# Patient Record
Sex: Male | Born: 1960
Health system: Southern US, Community
[De-identification: ages and names within clinical notes are randomized; demographics above are authoritative.]

## PROBLEM LIST (undated history)

## (undated) DIAGNOSIS — E7800 Pure hypercholesterolemia, unspecified: Secondary | ICD-10-CM

## (undated) DIAGNOSIS — I2510 Atherosclerotic heart disease of native coronary artery without angina pectoris: Secondary | ICD-10-CM

## (undated) DIAGNOSIS — R001 Bradycardia, unspecified: Secondary | ICD-10-CM

## (undated) DIAGNOSIS — I10 Essential (primary) hypertension: Secondary | ICD-10-CM

## (undated) HISTORY — PX: TONSILLECTOMY: SUR1361

---

## 2002-02-21 ENCOUNTER — Encounter: Payer: Self-pay | Admitting: Family Medicine

## 2002-02-21 ENCOUNTER — Ambulatory Visit (HOSPITAL_COMMUNITY): Admission: RE | Admit: 2002-02-21 | Discharge: 2002-02-21 | Payer: Self-pay | Admitting: Family Medicine

## 2002-02-24 ENCOUNTER — Ambulatory Visit (HOSPITAL_COMMUNITY): Admission: RE | Admit: 2002-02-24 | Discharge: 2002-02-24 | Payer: Self-pay | Admitting: Family Medicine

## 2002-02-24 ENCOUNTER — Encounter: Payer: Self-pay | Admitting: Family Medicine

## 2004-10-09 ENCOUNTER — Ambulatory Visit (HOSPITAL_COMMUNITY): Admission: RE | Admit: 2004-10-09 | Discharge: 2004-10-09 | Payer: Self-pay | Admitting: Family Medicine

## 2015-02-23 ENCOUNTER — Encounter (HOSPITAL_COMMUNITY): Payer: Self-pay | Admitting: *Deleted

## 2015-02-23 ENCOUNTER — Other Ambulatory Visit: Payer: Self-pay | Admitting: Cardiology

## 2015-02-23 ENCOUNTER — Emergency Department (HOSPITAL_COMMUNITY): Payer: 59

## 2015-02-23 ENCOUNTER — Inpatient Hospital Stay (HOSPITAL_COMMUNITY)
Admission: EM | Admit: 2015-02-23 | Discharge: 2015-02-26 | DRG: 246 | Disposition: A | Discharge status: Home / Self Care | Payer: 59 | Attending: Internal Medicine | Admitting: Internal Medicine

## 2015-02-23 DIAGNOSIS — R079 Chest pain, unspecified: Secondary | ICD-10-CM | POA: Diagnosis not present

## 2015-02-23 DIAGNOSIS — Z7982 Long term (current) use of aspirin: Secondary | ICD-10-CM | POA: Diagnosis not present

## 2015-02-23 DIAGNOSIS — Z6835 Body mass index (BMI) 35.0-35.9, adult: Secondary | ICD-10-CM | POA: Diagnosis not present

## 2015-02-23 DIAGNOSIS — Z823 Family history of stroke: Secondary | ICD-10-CM

## 2015-02-23 DIAGNOSIS — I214 Non-ST elevation (NSTEMI) myocardial infarction: Principal | ICD-10-CM | POA: Diagnosis present

## 2015-02-23 DIAGNOSIS — E6609 Other obesity due to excess calories: Secondary | ICD-10-CM | POA: Diagnosis present

## 2015-02-23 DIAGNOSIS — R001 Bradycardia, unspecified: Secondary | ICD-10-CM | POA: Diagnosis present

## 2015-02-23 DIAGNOSIS — Z7902 Long term (current) use of antithrombotics/antiplatelets: Secondary | ICD-10-CM | POA: Diagnosis not present

## 2015-02-23 DIAGNOSIS — E780 Pure hypercholesterolemia: Secondary | ICD-10-CM | POA: Diagnosis present

## 2015-02-23 DIAGNOSIS — I10 Essential (primary) hypertension: Secondary | ICD-10-CM | POA: Diagnosis present

## 2015-02-23 DIAGNOSIS — I2510 Atherosclerotic heart disease of native coronary artery without angina pectoris: Secondary | ICD-10-CM | POA: Diagnosis present

## 2015-02-23 DIAGNOSIS — E785 Hyperlipidemia, unspecified: Secondary | ICD-10-CM | POA: Diagnosis present

## 2015-02-23 DIAGNOSIS — I2542 Coronary artery dissection: Secondary | ICD-10-CM | POA: Diagnosis not present

## 2015-02-23 DIAGNOSIS — I472 Ventricular tachycardia: Secondary | ICD-10-CM | POA: Diagnosis not present

## 2015-02-23 DIAGNOSIS — Z79899 Other long term (current) drug therapy: Secondary | ICD-10-CM | POA: Diagnosis not present

## 2015-02-23 HISTORY — DX: Bradycardia, unspecified: R00.1

## 2015-02-23 HISTORY — DX: Atherosclerotic heart disease of native coronary artery without angina pectoris: I25.10

## 2015-02-23 HISTORY — DX: Essential (primary) hypertension: I10

## 2015-02-23 HISTORY — DX: Pure hypercholesterolemia, unspecified: E78.00

## 2015-02-23 LAB — PROTIME-INR
INR: 0.97 (ref 0.00–1.49)
Prothrombin Time: 13.1 seconds (ref 11.6–15.2)

## 2015-02-23 LAB — CBC WITH DIFFERENTIAL/PLATELET
BASOS ABS: 0 10*3/uL (ref 0.0–0.1)
BASOS PCT: 0 % (ref 0–1)
EOS ABS: 0.2 10*3/uL (ref 0.0–0.7)
EOS PCT: 2 % (ref 0–5)
HCT: 40.4 % (ref 39.0–52.0)
Hemoglobin: 13.9 g/dL (ref 13.0–17.0)
LYMPHS PCT: 28 % (ref 12–46)
Lymphs Abs: 1.9 10*3/uL (ref 0.7–4.0)
MCH: 30.3 pg (ref 26.0–34.0)
MCHC: 34.4 g/dL (ref 30.0–36.0)
MCV: 88.2 fL (ref 78.0–100.0)
Monocytes Absolute: 0.5 10*3/uL (ref 0.1–1.0)
Monocytes Relative: 7 % (ref 3–12)
NEUTROS ABS: 4.3 10*3/uL (ref 1.7–7.7)
Neutrophils Relative %: 63 % (ref 43–77)
PLATELETS: 170 10*3/uL (ref 150–400)
RBC: 4.58 MIL/uL (ref 4.22–5.81)
RDW: 12.9 % (ref 11.5–15.5)
WBC: 6.9 10*3/uL (ref 4.0–10.5)

## 2015-02-23 LAB — COMPREHENSIVE METABOLIC PANEL
ALBUMIN: 4 g/dL (ref 3.5–5.0)
ALT: 39 U/L (ref 17–63)
ANION GAP: 10 (ref 5–15)
AST: 37 U/L (ref 15–41)
Alkaline Phosphatase: 57 U/L (ref 38–126)
BUN: 13 mg/dL (ref 6–20)
CO2: 26 mmol/L (ref 22–32)
CREATININE: 0.96 mg/dL (ref 0.61–1.24)
Calcium: 9.2 mg/dL (ref 8.9–10.3)
Chloride: 104 mmol/L (ref 101–111)
GFR calc Af Amer: >60 mL/min (ref >60)
GFR calc non Af Amer: >60 mL/min (ref >60)
Glucose, Bld: 115 mg/dL — ABNORMAL HIGH (ref 65–99)
Potassium: 4 mmol/L (ref 3.5–5.1)
Sodium: 140 mmol/L (ref 135–145)
TOTAL PROTEIN: 7 g/dL (ref 6.5–8.1)
Total Bilirubin: 0.6 mg/dL (ref 0.3–1.2)

## 2015-02-23 LAB — I-STAT TROPONIN, ED: Troponin i, poc: 0.62 ng/mL (ref 0.00–0.08)

## 2015-02-23 LAB — TROPONIN I: Troponin I: 0.85 ng/mL (ref <0.031)

## 2015-02-23 LAB — LIPASE, BLOOD: Lipase: 30 U/L (ref 22–51)

## 2015-02-23 MED ORDER — ASPIRIN 81 MG PO CHEW
324.0000 mg | CHEWABLE_TABLET | Freq: Once | ORAL | Status: AC
  Administered 2015-02-23: 324 mg via ORAL

## 2015-02-23 MED ORDER — HEPARIN (PORCINE) IN NACL 100-0.45 UNIT/ML-% IJ SOLN
INTRAMUSCULAR | Status: AC
  Administered 2015-02-23: 25000 [IU] via INTRAVENOUS
  Filled 2015-02-23: qty 250

## 2015-02-23 MED ORDER — ONDANSETRON HCL 4 MG/2ML IJ SOLN
INTRAMUSCULAR | Status: AC
  Administered 2015-02-23: 4 mg
  Filled 2015-02-23: qty 2

## 2015-02-23 MED ORDER — HEPARIN BOLUS VIA INFUSION
4000.0000 [IU] | Freq: Once | INTRAVENOUS | Status: AC
  Administered 2015-02-23: 4000 [IU] via INTRAVENOUS

## 2015-02-23 MED ORDER — MORPHINE SULFATE 4 MG/ML IJ SOLN
INTRAMUSCULAR | Status: AC
  Administered 2015-02-23: 4 mg
  Filled 2015-02-23: qty 1

## 2015-02-23 MED ORDER — ASPIRIN 81 MG PO CHEW
CHEWABLE_TABLET | ORAL | Status: AC
  Filled 2015-02-23: qty 4

## 2015-02-23 MED ORDER — NITROGLYCERIN 2 % TD OINT
1.0000 [in_us] | TOPICAL_OINTMENT | Freq: Once | TRANSDERMAL | Status: AC
  Administered 2015-02-23: 1 [in_us] via TOPICAL
  Filled 2015-02-23: qty 1

## 2015-02-23 MED ORDER — HEPARIN (PORCINE) IN NACL 100-0.45 UNIT/ML-% IJ SOLN
1400.0000 [IU]/h | INTRAMUSCULAR | Status: DC
  Administered 2015-02-23: 25000 [IU] via INTRAVENOUS

## 2015-02-23 NOTE — ED Notes (Signed)
CRITICAL VALUE ALERT  Critical value received:  Trop 0.85  Date of notification:  02/23/15  Time of notification:  2121  Critical value read back:Yes.    Nurse who received alert:  [facility]  MD notified (1st page):  2121  Time of first page:  2121  MD notified (2nd page):  Time of second page:  Responding MD:  Effie Shy  Time MD responded:  2121

## 2015-02-23 NOTE — ED Notes (Signed)
Dr. Effie Shy notified of elevated troponin per i-stat.

## 2015-02-23 NOTE — Progress Notes (Signed)
ANTICOAGULATION CONSULT NOTE - Initial Consult  Pharmacy Consult for Heparin Indication: chest pain/ACS  No Known Allergies  Patient Measurements: Height:  (180.3 cm) Weight: 246 lb (111.585 kg) IBW/kg (Calculated) : 75.3 Heparin Dosing Weight: 99.3 kg  Vital Signs: Temp: 97.9 F (36.6 C) (07/23 1949) Temp Source: Oral (07/23 1949) BP: 133/69 mmHg (07/23 2030) Pulse Rate: 55 (07/23 2030)  Labs:  Recent Labs  02/23/15 1955  HGB 13.9  HCT 40.4  PLT 170  CREATININE 0.96    Estimated Creatinine Clearance: 110.4 mL/min (by C-G formula based on Cr of 0.96).   Medical History: Past Medical History  Diagnosis Date  . Hypertension   . Hypercholesterolemia     Medications:   (Not in a hospital admission)  Assessment: 54 yo male ED patient central chest pressure and tightness. Pain has been constant. Elevated troponin Labs reviewed PTA medications reviewed, no anticoagulants noted. Patient to be transferred to [facility]  Goal of Therapy:  Heparin level 0.3-0.7 units/ml Monitor platelets by anticoagulation protocol: Yes   Plan:  Give 4000 units bolus x 1 Start heparin infusion at 1400 units/hr Check anti-Xa level in 6-8  hours and daily while on heparin Continue to monitor H&H and platelets  Raquel James, Hayly Litsey Bennett 02/23/2015,9:17 PM

## 2015-02-23 NOTE — ED Provider Notes (Addendum)
CSN: [id number]     Arrival date & time 02/23/15  1941 History   First MD Initiated Contact with Patient 02/23/15 2019     Chief Complaint  Patient presents with  . Chest Pain     (Consider location/radiation/quality/duration/timing/severity/associated sxs/prior Treatment) HPI   Dennis Morrow is a 54 y.o. male who presents for evaluation of chest pain. He describes the chest pain as bilateral anterior as a "tight feeling" which radiates to his bilateral triceps. The pain has been constant since yesterday. The pain was somewhat better yesterday, but has worsened today. The pain is now constant at 5/10, for the last hours. He has taken aspirin times without relief of the pain, today. No similar episodes of pain in the past. He is been able to work doing his usual job yesterday, and some work around the house today. 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   Past Medical History  Diagnosis Date  . Hypertension   . Hypercholesterolemia    Past Surgical History  Procedure Laterality Date  . Tonsillectomy     Family History  Problem Relation Age of Onset  . CVA Mother    History  Substance Use Topics  . Smoking status: Never Smoker   . Smokeless tobacco: Not on file  . Alcohol Use: Yes     Comment: socially    Review of Systems    Allergies  Review of patient's allergies indicates no known allergies.  Home Medications   Prior to Admission medications   Medication Sig Start Date End Date Taking? Authorizing Provider  aspirin 81 MG chewable tablet Chew 81 mg by mouth once.   Yes Historical Provider, MD  calcium carbonate (TUMS - DOSED IN MG ELEMENTAL CALCIUM) 500 MG chewable tablet Chew 2 tablets by mouth as needed for indigestion or heartburn.   Yes Historical Provider, MD  ibuprofen (ADVIL,MOTRIN) 200 MG tablet Take 400 mg by mouth every 6 (six) hours as needed for moderate pain.   Yes Historical Provider, MD   BP 135/78 mmHg  Pulse 61  Temp(Src) 98.4 F  (36.9 C) (Oral)  Resp 18  Ht  (1.803 m)  Wt 253 lb 4.8 oz (114.896 kg)  BMI 35.34 kg/m2  SpO2 100% Physical Exam  ED Course  Procedures (including critical care time)  Medications  heparin ADULT infusion 100 units/mL (25000 units/250 mL) (25,000 Units Intravenous New Bag/Given 02/23/15 2120)  aspirin chewable tablet 324 mg (324 mg Oral Given 02/23/15 2022)  aspirin 81 MG chewable tablet (  Duplicate 02/23/15 2024)  heparin bolus via infusion 4,000 Units (4,000 Units Intravenous Given 02/23/15 2141)  nitroGLYCERIN (NITROGLYN) 2 % ointment 1 inch (1 inch Topical Given 02/23/15 2053)  morphine 4 MG/ML injection (4 mg  Given 02/23/15 2233)  ondansetron (ZOFRAN) 4 MG/2ML injection (4 mg  Given 02/23/15 2233)    Patient Vitals for the past 24 hrs:  BP Temp Temp src Pulse Resp SpO2 Height Weight  02/23/15 2355 135/78 mmHg 98.4 F (36.9 C) Oral 61 18 100 % - -  02/23/15 2349 - - - - - -  (1.803 m) 253 lb 4.8 oz (114.896 kg)  02/23/15 2300 119/56 mmHg - - 86 18 98 % - -  02/23/15 2200 130/70 mmHg - - (!) 59 20 99 % - -  02/23/15 2130 130/78 mmHg - - (!) 59 19 98 % - -  02/23/15 2100 124/66 mmHg - - (!) 53 22 98 % - -  02/23/15 2030 133/69 mmHg - - (!) 55 17 99 % - -  02/23/15 2000 136/74 mmHg - - (!) 55 24 97 % - -  02/23/15 1949 144/82 mmHg 97.9 F (36.6 C) Oral 63 19 99 % 5\' 11"  (1.803 m) 246 lb (111.585 kg)      Page to cardiology, at 2031; call back and discussion completed at 2050. Dr. Mayford Knife requests, that I start heparin, as anticoagulation, order a troponin I, and call her back.  Reevaluation- 21:00- patient is somewhat more comfortable at this time. He denies shortness of breath. He has mild diaphoresis.  Reevaluation- 21:30- pain is better now 3/10. Transfer being arranged to [facility] hospital.   CRITICAL CARE Performed by: Flint Melter Total critical care time: 45 minutes Critical care time was exclusive of separately billable procedures and treating other  patients. Critical care was necessary to treat or prevent imminent or life-threatening deterioration. Critical care was time spent personally by me on the following activities: development of treatment plan with patient and/or surrogate as well as nursing, discussions with consultants, evaluation of patient's response to treatment, examination of patient, obtaining history from patient or surrogate, ordering and performing treatments and interventions, ordering and review of laboratory studies, ordering and review of radiographic studies, pulse oximetry and re-evaluation of patient's condition.    Labs Review Labs Reviewed  COMPREHENSIVE METABOLIC PANEL - Abnormal; Notable for the following:    Glucose, Bld 115 (*)    All other components within normal limits  TROPONIN I - Abnormal; Notable for the following:    Troponin I 0.85 (*)    All other components within normal limits  I-STAT TROPOININ, ED - Abnormal; Notable for the following:    Troponin i, poc 0.62 (*)    All other components within normal limits  CBC WITH DIFFERENTIAL/PLATELET  LIPASE, BLOOD  PROTIME-INR  HEPARIN LEVEL (UNFRACTIONATED)    Imaging Review Dg Chest 2 View  02/23/2015   CLINICAL DATA:  Chest pain and fatigue  EXAM: CHEST  2 VIEW  COMPARISON:  None.  FINDINGS: Lungs are clear. Heart size and pulmonary vascularity are normal. No adenopathy. No bone lesions. No pneumothorax.  IMPRESSION: No abnormality noted.   Electronically Signed   By: Bretta Bang III M.D.   On: 02/23/2015 20:45     EKG Interpretation   Date/Time:  Saturday February 23 2015 19:50:52 EDT Ventricular Rate:  54 PR Interval:  161 QRS Duration: 77 QT Interval:  436 QTC Calculation: 413 R Axis:   13 Text Interpretation:  Sinus rhythm Low voltage, precordial leads No old  tracing to compare Confirmed by Gwenna Fuston  MD, Shakari Qazi [phone]) on 02/23/2015  8:20:00 PM      MDM   Final diagnoses:  NSTEMI (non-ST elevated myocardial infarction)     Chest pain secondary to NSTEMI. Patient's pain improved after treatment. He will require admission and stabilization in a cardiology setting. No acute CHF or evidence for  progressive coronary ischemia.  Nursing Notes Reviewed/ Care Coordinated Applicable Imaging Reviewed Interpretation of Laboratory Data incorporated into ED treatment   Plan- Transfer to [facility]    Mancel Bale, MD 02/24/15 0009  Mancel Bale, MD 03/13/15 1249

## 2015-02-23 NOTE — ED Notes (Signed)
Central chest pressure started around 1330 yesterday. Describes as pressure and tightness.  Denies SOB, but has had lightheadedness and weakness.  Pain has been constant and is 5/10.

## 2015-02-23 NOTE — H&P (Addendum)
Expand All Collapse All     Admit date: (Not on file) Referring Physician: Dr. Effie Shy Primary Cardiologist: None Chief complaint/reason for admission:Chest pain  HPI: This is a 53yo WM with a history of HTN and dyslipidemia who presented to [facility] with complaints of chest pain. The patient has been constant since yesterday. Today the pain worsened. He describes it as a tightness in his chest on both sides and radiates to his arms bilaterally. He took ASA without any relief today. 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 He says that today he was outside some and thought it was related to the heat.  This afternoon he went to the pool and then came home to grill. At dinner his pain all of a sudden escalated and he went to the ER.  He presented to [facility] ER where EKG was nonischemic. Initial POC trop was 0.62 and lab Troponin was elevated at 0.85. He is now transferred to [facility] for further evaluation.     PMH:  Past Medical History  Diagnosis Date  . Hypertension   . Hypercholesterolemia     PSH:  Past Surgical History  Procedure Laterality Date  . Tonsillectomy     ALLERGIES: Review of patient's allergies indicates no known allergies. Prior to Admit Meds:  (Not in a hospital admission) Family HX: No family history on file. Social HX:  History   Social History  . Marital Status: Married    Spouse Name: N/A  . Number of Children: N/A  . Years of Education: N/A   Occupational History  . Not on file.   Social History Main Topics  . Smoking status: Never Smoker   . Smokeless tobacco: Not on file  . Alcohol Use: Yes     Comment: socially  . Drug Use: No  . Sexual Activity: Not on file   Other Topics Concern  . Not on file   Social History Narrative  . No narrative on file     ROS: All 11 ROS were addressed and  are negative except what is stated in the HPI  PHYSICAL EXAM There were no vitals filed for this visit. General: Well developed, well nourished, in no acute distress Head: Eyes PERRLA, No xanthomas. Normal cephalic and atramatic Lungs: Clear bilaterally to auscultation and percussion. Heart: HRRR S1 S2 Pulses are 2+ & equal.  No carotid bruit. No JVD. No abdominal bruits. No femoral bruits. Abdomen: Bowel sounds are positive, abdomen soft and non-tender without masses  Extremities: No clubbing, cyanosis or edema. DP +1 Neuro: Alert and oriented X 3. Psych: Good affect, responds appropriately   Labs:  Lab Results  Component Value Date   WBC 6.9 02/23/2015   HGB 13.9 02/23/2015   HCT 40.4 02/23/2015   MCV 88.2 02/23/2015   PLT 170 02/23/2015    Recent Labs Lab 02/23/15 1955  NA 140  K 4.0  CL 104  CO2 26  BUN 13  CREATININE 0.96  CALCIUM 9.2  PROT 7.0  BILITOT 0.6  ALKPHOS 57  ALT 39  AST 37  GLUCOSE 115*    Recent Labs    Lab Results  Component Value Date   TROPONINI 0.85* 02/23/2015      Recent Labs    No results found for: PTT    Recent Labs    Lab Results  Component Value Date   INR 0.97 02/23/2015  Recent Labs    No results found for: CHOL    Recent Labs    No results found for: HDL    Recent Labs    No results found for: LDLCALC    Recent Labs    No results found for: TRIG    Recent Labs    No results found for: CHOLHDL    Recent Labs    No results found for: LDLDIRECT     Radiology: Dg Chest 2 View  02/23/2015 CLINICAL DATA: Chest pain and fatigue EXAM: CHEST 2 VIEW COMPARISON: None. FINDINGS: Lungs are clear. Heart size and pulmonary vascularity are normal. No adenopathy. No bone lesions. No pneumothorax. IMPRESSION: No abnormality noted. Electronically Signed By: Bretta Bang III M.D. On: 02/23/2015 20:45     EKG: Sinus bradycardia with no ST changes  ASSESSMENT/PLAN: 1. NSTEMI - CP has been constant since yesterday but EKG is nonischemic. He currently is pain free on IV Heparin gtt. Add NTG gtt.  Will continue IV Heparin/ASA.  Add high dose statin.  Check 2D echo in am.  Will need cath on Monday to evaluate coronary anatomy.  Check HbA1C and FLP in am.   2.  HTN - controlled 3.  Dyslipidemia - adding statin - he has had some problems with aches in the past on statins. 4.  NSVT on monitor shortly after admission.  He had been slightly bradycardic in the ER but HR now in the 70's.  Will add low dose BB with lopressor 12.5mg  BID.  If he has any further episodes may need to add IV Amio.      Quintella Reichert, MD  02/23/2015  11:41 PM

## 2015-02-24 ENCOUNTER — Encounter (HOSPITAL_COMMUNITY): Payer: Self-pay | Admitting: Cardiology

## 2015-02-24 DIAGNOSIS — I214 Non-ST elevation (NSTEMI) myocardial infarction: Secondary | ICD-10-CM | POA: Diagnosis present

## 2015-02-24 LAB — HEPARIN LEVEL (UNFRACTIONATED)
Heparin Unfractionated: 0.25 IU/mL — ABNORMAL LOW (ref 0.30–0.70)
Heparin Unfractionated: 0.58 IU/mL (ref 0.30–0.70)

## 2015-02-24 LAB — COMPREHENSIVE METABOLIC PANEL
ALT: 36 U/L (ref 17–63)
ANION GAP: 11 (ref 5–15)
AST: 51 U/L — ABNORMAL HIGH (ref 15–41)
Albumin: 3.4 g/dL — ABNORMAL LOW (ref 3.5–5.0)
Alkaline Phosphatase: 54 U/L (ref 38–126)
BUN: 14 mg/dL (ref 6–20)
CHLORIDE: 103 mmol/L (ref 101–111)
CO2: 25 mmol/L (ref 22–32)
Calcium: 8.5 mg/dL — ABNORMAL LOW (ref 8.9–10.3)
Creatinine, Ser: 1.05 mg/dL (ref 0.61–1.24)
GFR calc Af Amer: >60 mL/min (ref >60)
GFR calc non Af Amer: >60 mL/min (ref >60)
Glucose, Bld: 146 mg/dL — ABNORMAL HIGH (ref 65–99)
Potassium: 4 mmol/L (ref 3.5–5.1)
Sodium: 139 mmol/L (ref 135–145)
Total Bilirubin: 0.5 mg/dL (ref 0.3–1.2)
Total Protein: 6.1 g/dL — ABNORMAL LOW (ref 6.5–8.1)

## 2015-02-24 LAB — CBC WITH DIFFERENTIAL/PLATELET
Basophils Absolute: 0 10*3/uL (ref 0.0–0.1)
Basophils Relative: 0 % (ref 0–1)
EOS ABS: 0.2 10*3/uL (ref 0.0–0.7)
Eosinophils Relative: 2 % (ref 0–5)
HEMATOCRIT: 38 % — AB (ref 39.0–52.0)
HEMOGLOBIN: 13 g/dL (ref 13.0–17.0)
Lymphocytes Relative: 24 % (ref 12–46)
Lymphs Abs: 2 10*3/uL (ref 0.7–4.0)
MCH: 30.2 pg (ref 26.0–34.0)
MCHC: 34.2 g/dL (ref 30.0–36.0)
MCV: 88.2 fL (ref 78.0–100.0)
MONOS PCT: 6 % (ref 3–12)
Monocytes Absolute: 0.5 10*3/uL (ref 0.1–1.0)
Neutro Abs: 5.5 10*3/uL (ref 1.7–7.7)
Neutrophils Relative %: 68 % (ref 43–77)
Platelets: 212 10*3/uL (ref 150–400)
RBC: 4.31 MIL/uL (ref 4.22–5.81)
RDW: 13.3 % (ref 11.5–15.5)
WBC: 8.2 10*3/uL (ref 4.0–10.5)

## 2015-02-24 LAB — APTT: aPTT: 65 seconds — ABNORMAL HIGH (ref 24–37)

## 2015-02-24 LAB — TSH: TSH: 3.59 u[IU]/mL (ref 0.350–4.500)

## 2015-02-24 LAB — MAGNESIUM: MAGNESIUM: 1.9 mg/dL (ref 1.7–2.4)

## 2015-02-24 LAB — PROTIME-INR
INR: 1 (ref 0.00–1.49)
Prothrombin Time: 13.4 seconds (ref 11.6–15.2)

## 2015-02-24 LAB — TROPONIN I
TROPONIN I: 8.83 ng/mL — AB (ref <0.031)
Troponin I: 3.45 ng/mL (ref <0.031)
Troponin I: 9.52 ng/mL (ref <0.031)

## 2015-02-24 LAB — OCCULT BLOOD X 1 CARD TO LAB, STOOL: Fecal Occult Bld: NEGATIVE

## 2015-02-24 MED ORDER — METOPROLOL TARTRATE 12.5 MG HALF TABLET
12.5000 mg | ORAL_TABLET | Freq: Two times a day (BID) | ORAL | Status: DC
  Administered 2015-02-24 – 2015-02-26 (×5): 12.5 mg via ORAL
  Filled 2015-02-24 (×5): qty 1

## 2015-02-24 MED ORDER — NITROGLYCERIN 0.4 MG SL SUBL: 0.4000 mg | SUBLINGUAL_TABLET | SUBLINGUAL | Status: DC | PRN

## 2015-02-24 MED ORDER — METOPROLOL TARTRATE 12.5 MG HALF TABLET
12.5000 mg | ORAL_TABLET | Freq: Two times a day (BID) | ORAL | Status: DC
  Filled 2015-02-24: qty 1

## 2015-02-24 MED ORDER — ASPIRIN EC 81 MG PO TBEC
81.0000 mg | DELAYED_RELEASE_TABLET | Freq: Every day | ORAL | Status: DC
  Administered 2015-02-24 – 2015-02-26 (×3): 81 mg via ORAL
  Filled 2015-02-24 (×3): qty 1

## 2015-02-24 MED ORDER — SODIUM CHLORIDE 0.9 % IJ SOLN
3.0000 mL | Freq: Two times a day (BID) | INTRAMUSCULAR | Status: DC
  Administered 2015-02-24 – 2015-02-25 (×2): 3 mL via INTRAVENOUS

## 2015-02-24 MED ORDER — SODIUM CHLORIDE 0.9 % WEIGHT BASED INFUSION: 1.0000 mL/kg/h | INTRAVENOUS | Status: DC

## 2015-02-24 MED ORDER — SODIUM CHLORIDE 0.9 % IV SOLN: 250.0000 mL | INTRAVENOUS | Status: DC | PRN

## 2015-02-24 MED ORDER — ASPIRIN 81 MG PO CHEW
81.0000 mg | CHEWABLE_TABLET | ORAL | Status: AC
  Administered 2015-02-25: 81 mg via ORAL
  Filled 2015-02-24: qty 1

## 2015-02-24 MED ORDER — ASPIRIN 81 MG PO CHEW: 81.0000 mg | CHEWABLE_TABLET | Freq: Once | ORAL | Status: DC

## 2015-02-24 MED ORDER — HEPARIN (PORCINE) IN NACL 100-0.45 UNIT/ML-% IJ SOLN
1650.0000 [IU]/h | INTRAMUSCULAR | Status: DC
  Administered 2015-02-24 – 2015-02-25 (×2): 1650 [IU]/h via INTRAVENOUS
  Filled 2015-02-24 (×2): qty 250

## 2015-02-24 MED ORDER — ACETAMINOPHEN 325 MG PO TABS
650.0000 mg | ORAL_TABLET | ORAL | Status: DC | PRN
  Administered 2015-02-24 (×2): 650 mg via ORAL
  Filled 2015-02-24 (×4): qty 2

## 2015-02-24 MED ORDER — ASPIRIN 81 MG PO CHEW: 324.0000 mg | CHEWABLE_TABLET | ORAL | Status: DC

## 2015-02-24 MED ORDER — NITROGLYCERIN IN D5W 200-5 MCG/ML-% IV SOLN
3.0000 ug/min | INTRAVENOUS | Status: DC
  Administered 2015-02-24: 5 ug/min via INTRAVENOUS
  Filled 2015-02-24: qty 250

## 2015-02-24 MED ORDER — ASPIRIN 300 MG RE SUPP: 300.0000 mg | RECTAL | Status: DC

## 2015-02-24 MED ORDER — SODIUM CHLORIDE 0.9 % WEIGHT BASED INFUSION
3.0000 mL/kg/h | INTRAVENOUS | Status: DC
  Administered 2015-02-25: 3 mL/kg/h via INTRAVENOUS

## 2015-02-24 MED ORDER — ONDANSETRON HCL 4 MG/2ML IJ SOLN: 4.0000 mg | Freq: Four times a day (QID) | INTRAMUSCULAR | Status: DC | PRN

## 2015-02-24 MED ORDER — ATORVASTATIN CALCIUM 80 MG PO TABS
80.0000 mg | ORAL_TABLET | Freq: Every day | ORAL | Status: DC
  Administered 2015-02-24 – 2015-02-26 (×3): 80 mg via ORAL
  Filled 2015-02-24 (×3): qty 1

## 2015-02-24 MED ORDER — SODIUM CHLORIDE 0.9 % IJ SOLN: 3.0000 mL | INTRAMUSCULAR | Status: DC | PRN

## 2015-02-24 NOTE — Progress Notes (Signed)
CRITICAL VALUE ALERT  Critical value received:  Troponin 3.45  Date of notification:  02/24/15  Time of notification:  0220  Critical value read back:Yes.    Nurse who received alert:  Merry Proud, RN  MD notified (1st page):  Dr. Mayford Knife  Time of first page:  0225  MD notified (2nd page):  Time of second page:  Responding MD:  Dr. Mayford Knife  Time MD responded:  [phone]

## 2015-02-24 NOTE — Progress Notes (Signed)
ANTICOAGULATION CONSULT NOTE - Follow Up Consult  Pharmacy Consult for Heparin  Indication: chest pain/ACS  No Known Allergies  Patient Measurements: Height:  (180.3 cm) Weight: 253 lb 4.8 oz (114.896 kg) IBW/kg (Calculated) : 75.3  Vital Signs: Temp: 98.4 F (36.9 C) (07/23 2355) Temp Source: Oral (07/23 2355) BP: 135/78 mmHg (07/23 2355) Pulse Rate: 61 (07/23 2355)  Labs:  Recent Labs  02/23/15 1955 02/24/15 0106  HGB 13.9 13.0  HCT 40.4 38.0*  PLT 170 212  APTT  --  65*  LABPROT 13.1 13.4  INR 0.97 1.00  HEPARINUNFRC  --  0.25*  CREATININE 0.96  --   TROPONINI 0.85*  --     Estimated Creatinine Clearance: 112 mL/min (by C-G formula based on Cr of 0.96).   Assessment: Tx from [facility], initial heparin level is sub-therapeutic, other labs reviewed, no issues per RN.   Goal of Therapy:  Heparin level 0.3-0.7 units/ml Monitor platelets by anticoagulation protocol: Yes   Plan:  -Increase heparin to 1650 units/hr -1000 HL -Daily CBC/HL -Monitor for bleeding -Likely cath monday  Gianlucas Evenson 02/24/2015,2:19 AM

## 2015-02-24 NOTE — Progress Notes (Signed)
Subjective:  No recurrent chest pain overnight.  Troponin has risen to 3.145.  No shortness of breath.  Complains of mild headache.  Objective:  Vital Signs in the last 24 hours: BP 132/74 mmHg  Pulse 50  Temp(Src) 98 F (36.7 C) (Oral)  Resp 16  Ht  (1.803 m)  Wt 114.896 kg (253 lb 4.8 oz)  BMI 35.34 kg/m2  SpO2 99%  Physical Exam: Obese white male currently in no acute distress Lungs:  Clear Cardiac:  Regular rhythm, normal S1 and S2, no S3 Abdomen:  Soft, nontender, no masses Extremities:  No edema present  Intake/Output from previous day: 07/23 0701 - 07/24 0700 In: 151.4 [I.V.:151.4] Out: -   Weight Filed Weights   02/23/15 1949 02/23/15 2349  Weight: 111.585 kg (246 lb) 114.896 kg (253 lb 4.8 oz)    Lab Results: Basic Metabolic Panel:  Recent Labs  96/04/54 1955 02/24/15 0106  NA 140 139  K 4.0 4.0  CL 104 103  CO2 26 25  GLUCOSE 115* 146*  BUN 13 14  CREATININE 0.96 1.05   CBC:  Recent Labs  02/23/15 1955 02/24/15 0106  WBC 6.9 8.2  NEUTROABS 4.3 5.5  HGB 13.9 13.0  HCT 40.4 38.0*  MCV 88.2 88.2  PLT 170 212   Cardiac Enzymes: Troponin (Point of Care Test)  Recent Labs  02/23/15 1959  TROPIPOC 0.62*   Cardiac Panel (last 3 results)  Recent Labs  02/23/15 1955 02/24/15 0106  TROPONINI 0.85* 3.45*    Telemetry: Sinus bradycardia  Assessment/Plan:   1.  Non-STEMI currently chest pain-free 2.  Obesity neck sign 3.  Hypertension controlled 4.  Hyperlipidemia  Recommendations:  Catheterization with possible stenting tomorrow.  Cardiac catheterization procedure was discussed with the patient fully including risks of myocardial infarction, death, stroke, bleeding, arrhythmia, dye allergy, or renal insufficiency. The patient understands and is willing to proceed.  Possibility of intervention including risks also discussed with him.  Would be a candidate for drug-eluting stent.      Darden Palmer  MD  [facility]  02/24/2015, 8:55 AM

## 2015-02-24 NOTE — Progress Notes (Signed)
ANTICOAGULATION CONSULT NOTE - Follow Up Consult  Pharmacy Consult for Heparin  Indication: chest pain/ACS  No Known Allergies  Patient Measurements: Height:  (180.3 cm) Weight: 253 lb 4.8 oz (114.896 kg) IBW/kg (Calculated) : 75.3  Vital Signs: Temp: 98 F (36.7 C) (07/24 0457) Temp Source: Oral (07/24 0457) BP: 132/74 mmHg (07/24 0457) Pulse Rate: 45 (07/24 1000)  Labs:  Recent Labs  02/23/15 1955 02/24/15 0106 02/24/15 0650 02/24/15 1001  HGB 13.9 13.0  --   --   HCT 40.4 38.0*  --   --   PLT 170 212  --   --   APTT  --  65*  --   --   LABPROT 13.1 13.4  --   --   INR 0.97 1.00  --   --   HEPARINUNFRC  --  0.25*  --  0.58  CREATININE 0.96 1.05  --   --   TROPONINI 0.85* 3.45* 9.52*  --     Estimated Creatinine Clearance: 102.4 mL/min (by C-G formula based on Cr of 1.05).   Assessment: 54 yo male with NSTEMI on heparin at 1650 units/hr and heparin level is at goal (HL= 0.58). Patient noted for cath in am   Goal of Therapy:  Heparin level 0.3-0.7 units/ml Monitor platelets by anticoagulation protocol: Yes   Plan:  -No heparin changes needed -Daily heparin level and CBC  Harland German, Pharm D 02/24/2015 11:38 AM

## 2015-02-25 ENCOUNTER — Inpatient Hospital Stay (HOSPITAL_COMMUNITY): Payer: 59

## 2015-02-25 ENCOUNTER — Encounter (HOSPITAL_COMMUNITY)
Admission: EM | Disposition: A | Discharge status: Home / Self Care | Payer: Self-pay | Source: Home / Self Care | Attending: Internal Medicine

## 2015-02-25 DIAGNOSIS — I10 Essential (primary) hypertension: Secondary | ICD-10-CM

## 2015-02-25 DIAGNOSIS — E785 Hyperlipidemia, unspecified: Secondary | ICD-10-CM

## 2015-02-25 DIAGNOSIS — I214 Non-ST elevation (NSTEMI) myocardial infarction: Principal | ICD-10-CM

## 2015-02-25 DIAGNOSIS — R079 Chest pain, unspecified: Secondary | ICD-10-CM

## 2015-02-25 HISTORY — PX: CARDIAC CATHETERIZATION: SHX172

## 2015-02-25 LAB — CBC
HCT: 43.6 % (ref 39.0–52.0)
Hemoglobin: 14.8 g/dL (ref 13.0–17.0)
MCH: 30.5 pg (ref 26.0–34.0)
MCHC: 33.9 g/dL (ref 30.0–36.0)
MCV: 89.7 fL (ref 78.0–100.0)
PLATELETS: 143 10*3/uL — AB (ref 150–400)
RBC: 4.86 MIL/uL (ref 4.22–5.81)
RDW: 13.4 % (ref 11.5–15.5)
WBC: 8.4 10*3/uL (ref 4.0–10.5)

## 2015-02-25 LAB — HEMOGLOBIN A1C
Hgb A1c MFr Bld: 5.8 % — ABNORMAL HIGH (ref 4.8–5.6)
Mean Plasma Glucose: 120 mg/dL

## 2015-02-25 LAB — HEPARIN LEVEL (UNFRACTIONATED): HEPARIN UNFRACTIONATED: 0.43 [IU]/mL (ref 0.30–0.70)

## 2015-02-25 LAB — POCT ACTIVATED CLOTTING TIME: ACTIVATED CLOTTING TIME: 521 s

## 2015-02-25 SURGERY — LEFT HEART CATH AND CORONARY ANGIOGRAPHY
Anesthesia: LOCAL

## 2015-02-25 MED ORDER — SODIUM CHLORIDE 0.9 % IV SOLN
0.2500 mg/kg/h | INTRAVENOUS | Status: DC
  Filled 2015-02-25: qty 250

## 2015-02-25 MED ORDER — TICAGRELOR 90 MG PO TABS
90.0000 mg | ORAL_TABLET | Freq: Two times a day (BID) | ORAL | Status: DC
  Administered 2015-02-26 (×2): 90 mg via ORAL
  Filled 2015-02-25: qty 1

## 2015-02-25 MED ORDER — FENTANYL CITRATE (PF) 100 MCG/2ML IJ SOLN
INTRAMUSCULAR | Status: AC
  Filled 2015-02-25: qty 2

## 2015-02-25 MED ORDER — ATORVASTATIN CALCIUM 80 MG PO TABS
80.0000 mg | ORAL_TABLET | Freq: Every day | ORAL | Status: DC
Start: 2015-02-26 — End: 2015-02-25

## 2015-02-25 MED ORDER — MIDAZOLAM HCL 2 MG/2ML IJ SOLN
INTRAMUSCULAR | Status: AC
  Filled 2015-02-25: qty 2

## 2015-02-25 MED ORDER — BIVALIRUDIN BOLUS VIA INFUSION - CUPID
INTRAVENOUS | Status: DC | PRN
  Administered 2015-02-25: 83.925 mg via INTRAVENOUS

## 2015-02-25 MED ORDER — LIDOCAINE HCL (PF) 1 % IJ SOLN
INTRAMUSCULAR | Status: AC
  Filled 2015-02-25: qty 30

## 2015-02-25 MED ORDER — SODIUM CHLORIDE 0.9 % IV SOLN
INTRAVENOUS | Status: AC
  Administered 2015-02-25: 23:00:00 via INTRAVENOUS

## 2015-02-25 MED ORDER — NITROGLYCERIN 1 MG/10 ML FOR IR/CATH LAB
INTRA_ARTERIAL | Status: AC
  Filled 2015-02-25: qty 10

## 2015-02-25 MED ORDER — HEPARIN SODIUM (PORCINE) 1000 UNIT/ML IJ SOLN
INTRAMUSCULAR | Status: DC | PRN
  Administered 2015-02-25: 5600 [IU] via INTRAVENOUS

## 2015-02-25 MED ORDER — BIVALIRUDIN 250 MG IV SOLR
INTRAVENOUS | Status: AC
  Filled 2015-02-25: qty 250

## 2015-02-25 MED ORDER — RADIAL COCKTAIL (HEPARIN/VERAPAMIL/LIDOCAINE/NITRO)
Status: DC | PRN
  Administered 2015-02-25: 1 via INTRA_ARTERIAL

## 2015-02-25 MED ORDER — SODIUM CHLORIDE 0.9 % IJ SOLN: 3.0000 mL | INTRAMUSCULAR | Status: DC | PRN

## 2015-02-25 MED ORDER — ONDANSETRON HCL 4 MG/2ML IJ SOLN: 4.0000 mg | Freq: Four times a day (QID) | INTRAMUSCULAR | Status: DC | PRN

## 2015-02-25 MED ORDER — FENTANYL CITRATE (PF) 100 MCG/2ML IJ SOLN
INTRAMUSCULAR | Status: DC | PRN
  Administered 2015-02-25: 25 ug via INTRAVENOUS
  Administered 2015-02-25: 50 ug via INTRAVENOUS
  Administered 2015-02-25: 25 ug via INTRAVENOUS

## 2015-02-25 MED ORDER — BIVALIRUDIN 250 MG IV SOLR
250.0000 mg | INTRAVENOUS | Status: DC | PRN
  Administered 2015-02-25 (×3): 1.75 mg/kg/h via INTRAVENOUS

## 2015-02-25 MED ORDER — IOHEXOL 350 MG/ML SOLN
INTRAVENOUS | Status: DC | PRN
  Administered 2015-02-25: 400 mL via INTRACARDIAC

## 2015-02-25 MED ORDER — ACETAMINOPHEN 325 MG PO TABS
650.0000 mg | ORAL_TABLET | ORAL | Status: DC | PRN
  Administered 2015-02-25 – 2015-02-26 (×2): 650 mg via ORAL

## 2015-02-25 MED ORDER — HEPARIN (PORCINE) IN NACL 2-0.9 UNIT/ML-% IJ SOLN
INTRAMUSCULAR | Status: AC
  Filled 2015-02-25: qty 1500

## 2015-02-25 MED ORDER — SODIUM CHLORIDE 0.9 % IJ SOLN
3.0000 mL | Freq: Two times a day (BID) | INTRAMUSCULAR | Status: DC
  Administered 2015-02-26: 3 mL via INTRAVENOUS

## 2015-02-25 MED ORDER — ASPIRIN EC 81 MG PO TBEC: 81.0000 mg | DELAYED_RELEASE_TABLET | Freq: Every day | ORAL | Status: DC

## 2015-02-25 MED ORDER — DIAZEPAM 5 MG PO TABS
5.0000 mg | ORAL_TABLET | Freq: Four times a day (QID) | ORAL | Status: DC | PRN
  Administered 2015-02-25: 5 mg via ORAL
  Filled 2015-02-25: qty 1

## 2015-02-25 MED ORDER — TICAGRELOR 90 MG PO TABS
ORAL_TABLET | ORAL | Status: DC | PRN
  Administered 2015-02-25: 180 mg via ORAL

## 2015-02-25 MED ORDER — SODIUM CHLORIDE 0.9 % IV SOLN
1.7500 mg/kg/h | INTRAVENOUS | Status: AC
  Filled 2015-02-25: qty 250

## 2015-02-25 MED ORDER — NITROGLYCERIN 1 MG/10 ML FOR IR/CATH LAB
INTRA_ARTERIAL | Status: DC | PRN
  Administered 2015-02-25: 20:00:00

## 2015-02-25 MED ORDER — SODIUM CHLORIDE 0.9 % IV SOLN: 250.0000 mL | INTRAVENOUS | Status: DC | PRN

## 2015-02-25 MED ORDER — MIDAZOLAM HCL 2 MG/2ML IJ SOLN
INTRAMUSCULAR | Status: DC | PRN
  Administered 2015-02-25: 2 mg via INTRAVENOUS
  Administered 2015-02-25 (×2): 1 mg via INTRAVENOUS

## 2015-02-25 MED ORDER — TICAGRELOR 90 MG PO TABS
ORAL_TABLET | ORAL | Status: AC
  Filled 2015-02-25: qty 1

## 2015-02-25 MED ORDER — NITROGLYCERIN IN D5W 200-5 MCG/ML-% IV SOLN
0.0000 ug/min | INTRAVENOUS | Status: DC
  Administered 2015-02-25: 20 ug/min via INTRAVENOUS

## 2015-02-25 MED ORDER — TICAGRELOR 90 MG PO TABS
ORAL_TABLET | ORAL | Status: AC
Start: 2015-02-25 — End: 2015-02-25
  Filled 2015-02-25: qty 1

## 2015-02-25 SURGICAL SUPPLY — 27 items
BALLN MINITREK RX 2.0X12 (BALLOONS) ×3
BALLN TREK RX 2.5X12 (BALLOONS) ×3
BALLN ~~LOC~~ EUPHORA RX 2.5X15 (BALLOONS) ×3
BALLOON MINITREK RX 2.0X12 (BALLOONS) ×1 IMPLANT
BALLOON TREK RX 2.5X12 (BALLOONS) ×1 IMPLANT
BALLOON ~~LOC~~ EUPHORA RX 2.5X15 (BALLOONS) ×1 IMPLANT
CATH INFINITI 5FR ANG PIGTAIL (CATHETERS) ×3 IMPLANT
CATH INFINITI 5FR MULTPACK ANG (CATHETERS) IMPLANT
CATH OPTITORQUE TIG 4.0 5F (CATHETERS) ×3 IMPLANT
CATH VISTA GUIDE 6FR JR4 (CATHETERS) ×3 IMPLANT
DEVICE RAD COMP TR BAND LRG (VASCULAR PRODUCTS) ×3 IMPLANT
GLIDESHEATH SLEND A-KIT 6F 22G (SHEATH) ×3 IMPLANT
KIT ENCORE 26 ADVANTAGE (KITS) ×3 IMPLANT
KIT HEART LEFT (KITS) ×3 IMPLANT
PACK CARDIAC CATHETERIZATION (CUSTOM PROCEDURE TRAY) ×3 IMPLANT
SHEATH PINNACLE 5F 10CM (SHEATH) IMPLANT
STENT SYNERGY DES 2.25X24 (Permanent Stent) ×3 IMPLANT
STENT SYNERGY DES 2.5X20 (Permanent Stent) ×3 IMPLANT
SYR MEDRAD MARK V 150ML (SYRINGE) ×3 IMPLANT
TRANSDUCER W/STOPCOCK (MISCELLANEOUS) ×3 IMPLANT
TUBING CIL FLEX 10 FLL-RA (TUBING) ×3 IMPLANT
WIRE ASAHI MEDIUM 180CM (WIRE) ×3 IMPLANT
WIRE COUGAR XT STRL 190CM (WIRE) ×3 IMPLANT
WIRE EMERALD 3MM-J .035X150CM (WIRE) IMPLANT
WIRE LUGE 182CM (WIRE) ×3 IMPLANT
WIRE PT2 MS 185 (WIRE) ×3 IMPLANT
WIRE SAFE-T 1.5MM-J .035X260CM (WIRE) ×3 IMPLANT

## 2015-02-25 NOTE — Care Management Note (Signed)
Case Management Note  Patient Details  Name: Dennis Morrow MRN: [id number] Date of Birth: [birth year]  Subjective/Objective:     Pt admitted for Nstemi. Plan for cardiac cath.                Action/Plan: CM to continue to monitor for disposition needs.    Expected Discharge Date:                  Expected Discharge Plan:  Home/Self Care  In-House Referral:  NA  Discharge planning Services  CM Consult  Post Acute Care Choice:    Choice offered to:  NA  DME Arranged:  N/A DME Agency:  NA  HH Arranged:  NA HH Agency:  NA  Status of Service:  In process, will continue to follow  Medicare Important Message Given:    Date Medicare IM Given:    Medicare IM give by:    Date Additional Medicare IM Given:    Additional Medicare Important Message give by:     If discussed at Long Length of Stay Meetings, dates discussed:    Additional Comments:  Gala Lewandowsky, RN 02/25/2015, 10:09 AM

## 2015-02-25 NOTE — Interval H&P Note (Signed)
Cath Lab Visit (complete for each Cath Lab visit)  Clinical Evaluation Leading to the Procedure:   ACS: Yes.    Non-ACS:    Anginal Classification: CCS IV  Anti-ischemic medical therapy: Maximal Therapy (2 or more classes of medications)  Non-Invasive Test Results: No non-invasive testing performed  Prior CABG: No previous CABG      History and Physical Interval Note:  02/25/2015 4:41 PM  Dennis Morrow  has presented today for surgery, with the diagnosis of unstable angina  The various methods of treatment have been discussed with the patient and family. After consideration of risks, benefits and other options for treatment, the patient has consented to  Procedure(s): Left Heart Cath and Coronary Angiography (N/A) as a surgical intervention .  The patient's history has been reviewed, patient examined, no change in status, stable for surgery.  I have reviewed the patient's chart and labs.  Questions were answered to the patient's satisfaction.     [name] A

## 2015-02-25 NOTE — Progress Notes (Signed)
Patient Name: Dennis Morrow Date of Encounter: 02/25/2015  Primary Cardiologist: Dr. Mayford Knife   Principal Problem:   NSTEMI (non-ST elevated myocardial infarction) Active Problems:   Essential hypertension   Hyperlipidemia    SUBJECTIVE  Denies any CP or SOB. Got scared by the phlebotomist this morning as he was wearing ear plugs and did not hear her come in.  CURRENT MEDS . aspirin EC  81 mg Oral Daily  . atorvastatin  80 mg Oral q1800  . metoprolol tartrate  12.5 mg Oral BID  . sodium chloride  3 mL Intravenous Q12H    OBJECTIVE  Filed Vitals:   02/24/15 1000 02/24/15 2014 02/24/15 2300 02/25/15 0302  BP:  176/77 146/78 151/91  Pulse: 45 52 58 56  Temp:  98.4 F (36.9 C)  98.2 F (36.8 C)  TempSrc:  Oral  Oral  Resp:    18  Height:      Weight:    246 lb 12.8 oz (111.948 kg)  SpO2:  100%  98%    Intake/Output Summary (Last 24 hours) at 02/25/15 0821 Last data filed at 02/25/15 0344  Gross per 24 hour  Intake      0 ml  Output   1850 ml  Net  -1850 ml   Filed Weights   02/23/15 1949 02/23/15 2349 02/25/15 0302  Weight: 246 lb (111.585 kg) 253 lb 4.8 oz (114.896 kg) 246 lb 12.8 oz (111.948 kg)    PHYSICAL EXAM  General: Pleasant, NAD. Neuro: Alert and oriented X 3. Moves all extremities spontaneously. Psych: Normal affect. HEENT:  Normal  Neck: Supple without bruits or JVD. Lungs:  Resp regular and unlabored, CTA. Heart: RRR no s3, s4, or murmurs. Abdomen: Soft, non-tender, non-distended, BS + x 4.  Extremities: No clubbing, cyanosis or edema. DP/PT/Radials 2+ and equal bilaterally.  Accessory Clinical Findings  CBC  Recent Labs  02/23/15 1955 02/24/15 0106 02/25/15 0252  WBC 6.9 8.2 8.4  NEUTROABS 4.3 5.5  --   HGB 13.9 13.0 14.8  HCT 40.4 38.0* 43.6  MCV 88.2 88.2 89.7  PLT 170 212 143*   Basic Metabolic Panel  Recent Labs  02/23/15 1955 02/24/15 0106  NA 140 139  K 4.0 4.0  CL 104 103  CO2 26 25  GLUCOSE 115* 146*  BUN 13 14   CREATININE 0.96 1.05  CALCIUM 9.2 8.5*  MG  --  1.9   Liver Function Tests  Recent Labs  02/23/15 1955 02/24/15 0106  AST 37 51*  ALT 39 36  ALKPHOS 57 54  BILITOT 0.6 0.5  PROT 7.0 6.1*  ALBUMIN 4.0 3.4*    Recent Labs  02/23/15 1955  LIPASE 30   Cardiac Enzymes  Recent Labs  02/24/15 0106 02/24/15 0650 02/24/15 1335  TROPONINI 3.45* 9.52* 8.83*   Thyroid Function Tests  Recent Labs  02/24/15 0106  TSH 3.590    TELE Sinus brady with HR 40-50s, 3.8 sec pause around 2:58am this morning.     ECG  No new EKG  Echocardiogram  pending    Radiology/Studies  Dg Chest 2 View  02/23/2015   CLINICAL DATA:  Chest pain and fatigue  EXAM: CHEST  2 VIEW  COMPARISON:  None.  FINDINGS: Lungs are clear. Heart size and pulmonary vascularity are normal. No adenopathy. No bone lesions. No pneumothorax.  IMPRESSION: No abnormality noted.   Electronically Signed   By: Bretta Bang III M.D.   On: 02/23/2015 20:45  ASSESSMENT AND PLAN  54 yo WM with h/o HTN and HLD presented to [facility] with CP. Trop elevated, no EKG changes. Pending cath on Monday for NSTEMI  1. NSTEMI  - trop 0.62 --> 3.45 --> 9.52 --> 8.83  - on nitro gtt, pending echo and cath today  - risk and benefit of cardiac catheterization has been discussed yesterday and again today, he displayed clear understanding and agree to proceed.  2. HTN: potentially add ACEI or amlodipine after cath. May need to d/c metoprolol if has further prolonged pauses.  3. HLD 4. NSVT: BB added, metoprolol 12.5mg  BID   - no further episode of CP, however has some bradycardia  5. Bradycardia with HR 40-50s, 1 episode of 3.8 sec pause on telemetry around 2:58AM this morning when he was surprised by the phlegbotomist  Signed, Amedeo Plenty Pager: [phone] The patient is for cardiac catheterization today. Jerral Bonito, MD

## 2015-02-25 NOTE — Progress Notes (Signed)
UR Completed Jessia Kief Graves-Bigelow, RN,BSN [phone]  

## 2015-02-25 NOTE — Progress Notes (Signed)
Cardiologist on call Tilley,MD notified of pt having a 3.85s pause. VS stable & charted. Pt asymptomatic & stated that the phlebotomist had scared him to death. Per pt he was sleeping & woke up to the light being turned on & the phlebotomist standing over him. Will continue to monitor the pt. Sanda Linger, RN

## 2015-02-25 NOTE — H&P (View-Only) (Signed)
Patient Name: Dennis Morrow Date of Encounter: 02/25/2015  Primary Cardiologist: Dr. Mayford Knife   Principal Problem:   NSTEMI (non-ST elevated myocardial infarction) Active Problems:   Essential hypertension   Hyperlipidemia    SUBJECTIVE  Denies any CP or SOB. Got scared by the phlebotomist this morning as he was wearing ear plugs and did not hear her come in.  CURRENT MEDS . aspirin EC  81 mg Oral Daily  . atorvastatin  80 mg Oral q1800  . metoprolol tartrate  12.5 mg Oral BID  . sodium chloride  3 mL Intravenous Q12H    OBJECTIVE  Filed Vitals:   02/24/15 1000 02/24/15 2014 02/24/15 2300 02/25/15 0302  BP:  176/77 146/78 151/91  Pulse: 45 52 58 56  Temp:  98.4 F (36.9 C)  98.2 F (36.8 C)  TempSrc:  Oral  Oral  Resp:    18  Height:      Weight:    246 lb 12.8 oz (111.948 kg)  SpO2:  100%  98%    Intake/Output Summary (Last 24 hours) at 02/25/15 0821 Last data filed at 02/25/15 0344  Gross per 24 hour  Intake      0 ml  Output   1850 ml  Net  -1850 ml   Filed Weights   02/23/15 1949 02/23/15 2349 02/25/15 0302  Weight: 246 lb (111.585 kg) 253 lb 4.8 oz (114.896 kg) 246 lb 12.8 oz (111.948 kg)    PHYSICAL EXAM  General: Pleasant, NAD. Neuro: Alert and oriented X 3. Moves all extremities spontaneously. Psych: Normal affect. HEENT:  Normal  Neck: Supple without bruits or JVD. Lungs:  Resp regular and unlabored, CTA. Heart: RRR no s3, s4, or murmurs. Abdomen: Soft, non-tender, non-distended, BS + x 4.  Extremities: No clubbing, cyanosis or edema. DP/PT/Radials 2+ and equal bilaterally.  Accessory Clinical Findings  CBC  Recent Labs  02/23/15 1955 02/24/15 0106 02/25/15 0252  WBC 6.9 8.2 8.4  NEUTROABS 4.3 5.5  --   HGB 13.9 13.0 14.8  HCT 40.4 38.0* 43.6  MCV 88.2 88.2 89.7  PLT 170 212 143*   Basic Metabolic Panel  Recent Labs  02/23/15 1955 02/24/15 0106  NA 140 139  K 4.0 4.0  CL 104 103  CO2 26 25  GLUCOSE 115* 146*  BUN 13 14   CREATININE 0.96 1.05  CALCIUM 9.2 8.5*  MG  --  1.9   Liver Function Tests  Recent Labs  02/23/15 1955 02/24/15 0106  AST 37 51*  ALT 39 36  ALKPHOS 57 54  BILITOT 0.6 0.5  PROT 7.0 6.1*  ALBUMIN 4.0 3.4*    Recent Labs  02/23/15 1955  LIPASE 30   Cardiac Enzymes  Recent Labs  02/24/15 0106 02/24/15 0650 02/24/15 1335  TROPONINI 3.45* 9.52* 8.83*   Thyroid Function Tests  Recent Labs  02/24/15 0106  TSH 3.590    TELE Sinus brady with HR 40-50s, 3.8 sec pause around 2:58am this morning.     ECG  No new EKG  Echocardiogram  pending    Radiology/Studies  Dg Chest 2 View  02/23/2015   CLINICAL DATA:  Chest pain and fatigue  EXAM: CHEST  2 VIEW  COMPARISON:  None.  FINDINGS: Lungs are clear. Heart size and pulmonary vascularity are normal. No adenopathy. No bone lesions. No pneumothorax.  IMPRESSION: No abnormality noted.   Electronically Signed   By: Bretta Bang III M.D.   On: 02/23/2015 20:45  ASSESSMENT AND PLAN  54 yo WM with h/o HTN and HLD presented to [facility] with CP. Trop elevated, no EKG changes. Pending cath on Monday for NSTEMI  1. NSTEMI  - trop 0.62 --> 3.45 --> 9.52 --> 8.83  - on nitro gtt, pending echo and cath today  - risk and benefit of cardiac catheterization has been discussed yesterday and again today, he displayed clear understanding and agree to proceed.  2. HTN: potentially add ACEI or amlodipine after cath. May need to d/c metoprolol if has further prolonged pauses.  3. HLD 4. NSVT: BB added, metoprolol 12.5mg  BID   - no further episode of CP, however has some bradycardia  5. Bradycardia with HR 40-50s, 1 episode of 3.8 sec pause on telemetry around 2:58AM this morning when he was surprised by the phlegbotomist  Signed, Amedeo Plenty Pager: [phone] The patient is for cardiac catheterization today. Jerral Bonito, MD

## 2015-02-25 NOTE — Progress Notes (Signed)
ANTICOAGULATION CONSULT NOTE - Follow Up Consult  Pharmacy Consult for Heparin  Indication: chest pain/ACS  No Known Allergies  Patient Measurements: Height:  (180.3 cm) Weight: 246 lb 12.8 oz (111.948 kg) IBW/kg (Calculated) : 75.3  Vital Signs: Temp: 98.2 F (36.8 C) (07/25 0302) Temp Source: Oral (07/25 0302) BP: 145/92 mmHg (07/25 0936) Pulse Rate: 52 (07/25 0936)  Labs:  Recent Labs  02/23/15 1955 02/24/15 0106 02/24/15 0650 02/24/15 1001 02/24/15 1335 02/25/15 0252  HGB 13.9 13.0  --   --   --  14.8  HCT 40.4 38.0*  --   --   --  43.6  PLT 170 212  --   --   --  143*  APTT  --  65*  --   --   --   --   LABPROT 13.1 13.4  --   --   --   --   INR 0.97 1.00  --   --   --   --   HEPARINUNFRC  --  0.25*  --  0.58  --  0.43  CREATININE 0.96 1.05  --   --   --   --   TROPONINI 0.85* 3.45* 9.52*  --  8.83*  --     Estimated Creatinine Clearance: 101.1 mL/min (by C-G formula based on Cr of 1.05).   Assessment: 54 yo male with NSTEMI, therapeutic on heparin at 1650 units/hr. CBC stable, no anticoagulation prior to admission. No s/sx bleeding.   Goal of Therapy:  Heparin level 0.3-0.7 units/ml Monitor platelets by anticoagulation protocol: Yes   Plan:  -Continue heparin at 1650 units/hr -Daily heparin level and CBC -F/u after cath today     Agapito Games, PharmD, BCPS Clinical Pharmacist Pager: [phone] 02/25/2015 10:16 AM

## 2015-02-25 NOTE — Progress Notes (Signed)
  Echocardiogram 2D Echocardiogram has been performed.  Dennis Morrow 02/25/2015, 11:52 AM

## 2015-02-26 ENCOUNTER — Encounter (HOSPITAL_COMMUNITY): Payer: Self-pay | Admitting: Cardiovascular Disease

## 2015-02-26 LAB — BASIC METABOLIC PANEL
Anion gap: 9 (ref 5–15)
BUN: 9 mg/dL (ref 6–20)
CALCIUM: 8.9 mg/dL (ref 8.9–10.3)
CO2: 25 mmol/L (ref 22–32)
CREATININE: 0.98 mg/dL (ref 0.61–1.24)
Chloride: 102 mmol/L (ref 101–111)
GFR calc Af Amer: >60 mL/min (ref >60)
Glucose, Bld: 153 mg/dL — ABNORMAL HIGH (ref 65–99)
POTASSIUM: 3.7 mmol/L (ref 3.5–5.1)
Sodium: 136 mmol/L (ref 135–145)

## 2015-02-26 LAB — CBC
HCT: 40 % (ref 39.0–52.0)
HEMOGLOBIN: 13.7 g/dL (ref 13.0–17.0)
MCH: 30.4 pg (ref 26.0–34.0)
MCHC: 34.3 g/dL (ref 30.0–36.0)
MCV: 88.9 fL (ref 78.0–100.0)
Platelets: 154 10*3/uL (ref 150–400)
RBC: 4.5 MIL/uL (ref 4.22–5.81)
RDW: 13.4 % (ref 11.5–15.5)
WBC: 8 10*3/uL (ref 4.0–10.5)

## 2015-02-26 LAB — HEPARIN LEVEL (UNFRACTIONATED): Heparin Unfractionated: <0.1 IU/mL — ABNORMAL LOW (ref 0.30–0.70)

## 2015-02-26 MED ORDER — ASPIRIN 81 MG PO TBEC: 81.0000 mg | DELAYED_RELEASE_TABLET | Freq: Every day | ORAL | Status: AC

## 2015-02-26 MED ORDER — TICAGRELOR 90 MG PO TABS: 90.0000 mg | ORAL_TABLET | Freq: Two times a day (BID) | ORAL | Status: DC

## 2015-02-26 MED ORDER — RAMIPRIL 2.5 MG PO CAPS
2.5000 mg | ORAL_CAPSULE | Freq: Every day | ORAL | Status: DC
  Administered 2015-02-26: 2.5 mg via ORAL
  Filled 2015-02-26: qty 1

## 2015-02-26 MED ORDER — METOPROLOL TARTRATE 25 MG PO TABS: 12.5000 mg | ORAL_TABLET | Freq: Two times a day (BID) | ORAL | Status: DC

## 2015-02-26 MED ORDER — NITROGLYCERIN 0.4 MG SL SUBL: 0.4000 mg | SUBLINGUAL_TABLET | SUBLINGUAL | Status: DC | PRN

## 2015-02-26 MED ORDER — ATORVASTATIN CALCIUM 80 MG PO TABS: 80.0000 mg | ORAL_TABLET | Freq: Every day | ORAL | Status: DC

## 2015-02-26 MED ORDER — RAMIPRIL 2.5 MG PO CAPS: 2.5000 mg | ORAL_CAPSULE | Freq: Every day | ORAL | Status: DC

## 2015-02-26 NOTE — Progress Notes (Signed)
Correction to discharge summary, ASA is daily, not once.   Ramond Dial PA Pager: [phone]

## 2015-02-26 NOTE — Progress Notes (Signed)
Notified Azalee Course about run of NSVT and patient will need order for BMET  If he wants lab to be collected this morning.Order placed and lab called.

## 2015-02-26 NOTE — Discharge Instructions (Signed)
Acute Coronary Syndrome °Acute coronary syndrome (ACS) is an urgent problem in which the blood and oxygen supply to the heart is critically deficient. ACS requires hospitalization because one or more coronary arteries may be blocked. °ACS represents a range of conditions including: °· Previous angina that is now unstable, lasts longer, happens at rest, or is more intense. °· A heart attack, with heart muscle cell injury and death. °There are three vital coronary arteries that supply the heart muscle with blood and oxygen so that it can pump blood effectively. If blockages to these arteries develop, blood flow to the heart muscle is reduced. If the heart does not get enough blood, angina may occur as the first warning sign. °SYMPTOMS  °· The most common signs of angina include: °¨ Tightness or squeezing in the chest. °¨ Feeling of heaviness on the chest. °¨ Discomfort in the arms, neck, back, or jaw. °¨ Shortness of breath and nausea. °¨ Cold, wet skin. °· Angina is usually brought on by physical effort or excitement which increase the oxygen needs of the heart. These states increase the blood flow needs of the heart beyond what can be delivered. °· Other symptoms that are not as common include: °¨ Fatigue °¨ Unexplained feelings of nervousness or anxiety °¨ Weakness °¨ Diarrhea °· Sometimes, you may not have noticed any symptoms at all but still suffered a cardiac injury. °TREATMENT  °· Medicines to help discomfort may include nitroglycerin (nitro) in the form of tablets or a spray for rapid relief, or longer-acting forms such as cream, patches, or capsules. (Be aware that there are many side effects and possible interactions with other drugs). °· Other medicines may be used to help the heart pump better. °· Procedures to open blocked arteries including angioplasty or stent placement to keep the arteries open. °· Open heart surgery may be needed when there are many blockages or they are in critical locations that  are best treated with surgery. °HOME CARE INSTRUCTIONS  °· Do not use any tobacco products including cigarettes, chewing tobacco, or electronic cigarettes. °· Take one baby or adult aspirin daily, if your health care provider advises. This helps reduce the risk of a heart attack. °· It is very important that you follow the angina treatment prescribed by your health care provider. Make arrangements for proper follow-up care. °· Eat a heart healthy diet with salt and fat restrictions as advised. °· Regular exercise is good for you as long as it does not cause discomfort. Do not begin any new type of exercise until you check with your health care provider. °· If you are overweight, you should lose weight. °· Try to maintain normal blood lipid levels. °· Keep your blood pressure under control as recommended by your health care provider. °· You should tell your health care provider right away about any increase in the severity or frequency of your chest discomfort or angina attacks. When you have angina, you should stop what you are doing and sit down. This may bring relief in 3 to 5 minutes. If your health care provider has prescribed nitro, take it as directed. °· If your health care provider has given you a follow-up appointment, it is very important to keep that appointment. Not keeping the appointment could result in a chronic or permanent injury, pain, and disability. If there is any problem keeping the appointment, you must call back to this facility for assistance. °SEEK IMMEDIATE MEDICAL CARE IF:  °· You develop nausea, vomiting, or shortness   of breath. °· You feel faint, lightheaded, or pass out. °· Your chest discomfort gets worse. °· You are sweating or experience sudden profound fatigue. °· You do not get relief of your chest pain after 3 doses of nitro. °· Your discomfort lasts longer than 15 minutes. °MAKE SURE YOU:  °· Understand these instructions. °· Will watch your condition. °· Will get help right  away if you are not doing well or get worse. °· Take all medicines as directed by your health care provider. °Document Released: 07/20/2005 Document Revised: 07/25/2013 Document Reviewed: 11/21/2013 °[employer]® Patient Information ©2015 [employer]. This information is not intended to replace advice given to you by your health care provider. Make sure you discuss any questions you have with your health care provider. ° °No driving for 24hours. No lifting over 5 lbs for 1 week. No sexual activity for 1 week. Keep procedure site clean & dry. If you notice increased pain, swelling, bleeding or pus, call/return!  You may shower, but no soaking baths/hot tubs/pools for 1 week.  ° ° °

## 2015-02-26 NOTE — Progress Notes (Signed)
Patient Name: Dennis Morrow Date of Encounter: 02/26/2015  Primary Cardiologist: Dr. Mayford Knife   Principal Problem:   NSTEMI (non-ST elevated myocardial infarction) Active Problems:   Essential hypertension   Hyperlipidemia    SUBJECTIVE  Denies any CP or SOB.   CURRENT MEDS . aspirin EC  81 mg Oral Daily  . atorvastatin  80 mg Oral q1800  . metoprolol tartrate  12.5 mg Oral BID  . sodium chloride  3 mL Intravenous Q12H  . ticagrelor  90 mg Oral BID    OBJECTIVE  Filed Vitals:   02/25/15 2300 02/26/15 0001 02/26/15 0315 02/26/15 0713  BP: 132/71 117/72 135/80 138/68  Pulse:  56  48  Temp:    97.3 F (36.3 C)  TempSrc:    Oral  Resp: Height:      Weight:  246 lb 11.1 oz (111.9 kg)    SpO2: 94% 91% 97% 93%    Intake/Output Summary (Last 24 hours) at 02/26/15 0744 Last data filed at 02/26/15 0713  Gross per 24 hour  Intake 2441.13 ml  Output   1700 ml  Net 741.13 ml   Filed Weights   02/25/15 0302 02/25/15 2035 02/26/15 0001  Weight: 246 lb 12.8 oz (111.948 kg) 246 lb 11.1 oz (111.9 kg) 246 lb 11.1 oz (111.9 kg)    PHYSICAL EXAM  General: Pleasant, NAD. Neuro: Alert and oriented X 3. Moves all extremities spontaneously. Psych: Normal affect. HEENT:  Normal  Neck: Supple without bruits or JVD. Lungs:  Resp regular and unlabored, CTA. Heart: RRR no s3, s4, or murmurs. R radial cath stable.  Abdomen: Soft, non-tender, non-distended, BS + x 4.  Extremities: No clubbing, cyanosis or edema. DP/PT/Radials 2+ and equal bilaterally.  Accessory Clinical Findings  CBC  Recent Labs  02/23/15 1955 02/24/15 0106 02/25/15 0252 02/26/15 0332  WBC 6.9 8.2 8.4 8.0  NEUTROABS 4.3 5.5  --   --   HGB 13.9 13.0 14.8 13.7  HCT 40.4 38.0* 43.6 40.0  MCV 88.2 88.2 89.7 88.9  PLT 170 212 143* 154   Basic Metabolic Panel  Recent Labs  02/23/15 1955 02/24/15 0106  NA 140 139  K 4.0 4.0  CL 104 103  CO2 26 25  GLUCOSE 115* 146*  BUN 13 14    CREATININE 0.96 1.05  CALCIUM 9.2 8.5*  MG  --  1.9   Liver Function Tests  Recent Labs  02/23/15 1955 02/24/15 0106  AST 37 51*  ALT 39 36  ALKPHOS 57 54  BILITOT 0.6 0.5  PROT 7.0 6.1*  ALBUMIN 4.0 3.4*    Recent Labs  02/23/15 1955  LIPASE 30   Cardiac Enzymes  Recent Labs  02/24/15 0106 02/24/15 0650 02/24/15 1335  TROPONINI 3.45* 9.52* 8.83*   Thyroid Function Tests  Recent Labs  02/24/15 0106  TSH 3.590    TELE Sinus brady with HR 40-50s, 1 episode of 16 beats NSVT this morning     ECG  NSR without significant ST changes, nonspecific TW changes  Echocardiogram  LV EF: 50% -  55%  ------------------------------------------------------------------- Indications:   Chest pain 786.51.  ------------------------------------------------------------------- History:  Risk factors: Hypertension. Dyslipidemia.  ------------------------------------------------------------------- Study Conclusions  - Left ventricle: The cavity size was normal. Wall thickness was increased in a pattern of mild LVH. Systolic function was normal. The estimated ejection fraction was in the range of 50% to 55%. Wall motion was normal; there were no regional wall motion  abnormalities. Left ventricular diastolic function parameters were normal. - Left atrium: The atrium was mildly dilated.    Radiology/Studies  Dg Chest 2 View  02/23/2015   CLINICAL DATA:  Chest pain and fatigue  EXAM: CHEST  2 VIEW  COMPARISON:  None.  FINDINGS: Lungs are clear. Heart size and pulmonary vascularity are normal. No adenopathy. No bone lesions. No pneumothorax.  IMPRESSION: No abnormality noted.   Electronically Signed   By: Bretta Bang III M.D.   On: 02/23/2015 20:45    ASSESSMENT AND PLAN  54 yo WM with h/o HTN and HLD presented to [facility] with CP. Trop elevated, no EKG changes. Pending cath on Monday for NSTEMI  1. NSTEMI  - trop 0.62 --> 3.45 --> 9.52 -->  8.83  - Echo 02/25/2015 EF 50-55%. No RWMA  - cath 02/25/2015 50% mid LAD, 100% distal RCA after takeoff of the PDA treated with DES (2.520 mm Synergy DES stent from distal RCA to PLA2, 2.2524 mm Synergy DES stent to PLA1) with transient dissection in the more distal PLA2 treated with PTCA. Continue DAPT for at least 1 year.   - ASA, Brilinta, BB and statin.   2. HTN: May need to d/c metoprolol if has further prolonged pauses.   - add 2.5 mg ramipril  3. HLD  4. NSVT: BB added, metoprolol 12.5mg  BID  - single episode of 16 beats run of NSVT in am of 7/26, discussed with the pt regarding monitoring overnight for recurrent VT, he prefers to monitor for today and potentially be discharged tonight after 5pm which is reasonable.   - unable to uptitrate BB given bradycardia  5. Bradycardia with HR 40-50s, 1 episode of 3.8 sec pause on telemetry around 2:58AM in AM of 7/25 when he was surprised by the phlegbotomist  Signed, Azalee Course PA-C Pager: [phone] Patient seen and examined. I agree with the assessment and plan as detailed above. See also my additional thoughts below.   The patient feels well and is stable. The bradycardia that he had yesterday was vagal. He did have some wide complex tachycardia during the night. He has normal left ventricular function and he is been reperfused. He denies have talked about whether he can be monitored overnight for an additional night. He prefers to go home today. There is no reason to believe that he should have any significant abnormality. He will be discharged later today. Willa Rough, MD, [facility] 02/26/2015 9:24 AM

## 2015-02-26 NOTE — Progress Notes (Signed)
Patient had 16 beat run NSVT denies any pain and asymptomatic I will continue to monitor.

## 2015-02-26 NOTE — Progress Notes (Signed)
TR BAND REMOVAL  LOCATION:  right radial  DEFLATED PER PROTOCOL:  Yes.    TIME BAND OFF / DRESSING APPLIED:   0300   SITE UPON ARRIVAL:   Level 0  SITE AFTER BAND REMOVAL:  Level 0  REVERSE ALLEN'S TEST:    positive  CIRCULATION SENSATION AND MOVEMENT:  Within Normal Limits  Yes.    COMMENTS:  Patient arrived with angiomax that ran 4 hours then 2 hours later deflation began

## 2015-02-26 NOTE — Progress Notes (Signed)
CM provided pt with  Brilinta booklet with 30 day free card  and copay card enclosed. CM explained usage of each card and pt verbally stated understanding of each. [facility] called per CM to confirm medication is in stock and made pt aware. No other needs identified per CM @ present time. Gae Gallop RN,BSN,CM [phone]

## 2015-02-26 NOTE — Progress Notes (Signed)
CARDIAC REHAB PHASE I   PRE:  Rate/Rhythm: 65 Sr  BP:  Sitting: 142/76        SaO2: 97 RA  MODE:  Ambulation: 1000 ft   POST:  Rate/Rhythm: 69 SR  BP:  Sitting: 150/77         SaO2: 98 RA  Pt ambulated 1000 ft on RA, independent, brisk, steady gait, tolerated well.  Pt denies cp, dizziness, DOE, declined rest stop. Completed MI/stent education.  Reviewed risk factors, anti-platelet therapy, stent card, activity restrictions, ntg, exercise, heart healthy diet, portion control and phase 2 cardiac rehab. Pt verbalized understanding. Pt agrees to phase 2 cardiac rehab. Will send referral to [facility]. Pt acknowledges he will need to make significant lifestyle changes to comply with recommendations, pt states he plays golf and goes to the pool regularly (5x/week) where he drinks "socially" 6-8 beers. Pt also reports eating large portions of food regularly. Also of note pt states he has not tolerated taking a statin in the past and will not take it if prescribed this time. Encouraged cardiac rehab for lifestyle modification. Pt verbalized understanding. Pt to recliner after walk, call bell within reach.  [phone]      Joylene Grapes, RN, BSN 02/26/2015 10:25 AM

## 2015-02-26 NOTE — Discharge Summary (Signed)
Discharge Summary   Patient ID: Dennis Morrow,  MRN: [id number], DOB/AGE: 56/01/1961 56 y.o.  Admit date: 02/23/2015 Discharge date: 02/26/2015  Primary Care Provider: No primary care provider on file. Primary Cardiologist: Dr. Mayford Knife  Discharge Diagnoses Principal Problem:   NSTEMI (non-ST elevated myocardial infarction) Active Problems:   Essential hypertension   Hyperlipidemia   Allergies No Known Allergies  Procedures  Echocardiogram 02/25/2015 LV EF: 50% -  55%  ------------------------------------------------------------------- Indications:   Chest pain 786.51.  ------------------------------------------------------------------- History:  Risk factors: Hypertension. Dyslipidemia.  ------------------------------------------------------------------- Study Conclusions  - Left ventricle: The cavity size was normal. Wall thickness was increased in a pattern of mild LVH. Systolic function was normal. The estimated ejection fraction was in the range of 50% to 55%. Wall motion was normal; there were no regional wall motion abnormalities. Left ventricular diastolic function parameters were normal. - Left atrium: The atrium was mildly dilated.    Cardiac catheterization 02/25/2015 Conclusion     Mid LAD lesion, 50% stenosed.  Post Atrio lesion, 100% stenosed. There is a 0% residual stenosis post intervention.  A drug-eluting stent was placed.  RPDA lesion, 85% stenosed. There is a 0% residual stenosis post intervention.  The left ventricular systolic function is normal.  Preserved global LV contractility without focal segmental wall motion abnormalities, ejection fraction 55%  2 vessel cardiac disease with 50% proximal LAD stenosis; normal left circumflex coronary artery; and dominant RCA with total occlusion in the distal RCA continuation branch after the takeoff of the PDA vessel with TIMI 0 flow and very faint left-to-right collaterals seen  to supply 2 vessels, and diffuse 90% stenoses in the mid PDA vessel.  Difficult but successful intervention to the RCA with ultimate PTCA/stenting of the distal RCA extending into the PLA 2 vessel with a 2.520 mm Synergy DES stent with the 100% occlusion being reduced to 0% and with transient dissection in the more distal PLA 2 treated with PTCA; PTCA of the PLA 1 vessel with 100% occlusion being reduced to 30% proximally and PTCA/DES stenting of the diffuse 90% PDA vessel stenoses being reduced to 0% with ultimate insertion of a 2.2524 mm Synergy DES stent.  RECOMMENDATION:  Patient will continue with dual antiplatelets therapy for minimum of one-year a preferably longer. Medical therapy for concomitant CAD. Aggressive lipid-lowering therapy, blood pressure control.        Hospital Course  Is a 54 year old Caucasian male with PMH of HTN and HLD who presented to [facility] hospital on 7/on his 10/2014 with chest pain radiating to bilateral arm. [facility] and the EKG was nonischemic. Initial troponin was elevated at 0.62 and subsequent troponin trended up to 0.85. He was transferred to [facility] for further evaluation. After he was placed on IV heparin and nitroglycerin, he was chest pain-free. He was placed on low-dose beta blocker 12.5 mg twice a day, we were unable to up titrate given his baseline bradycardia. He did have a 3.8 second pause in the morning of 7/25 when he was startled by phlebotomist, however since then he has not had any significant sinus pauses on telemetry.   He underwent cardiac catheterization on 02/25/2015 which showed 50% mid LAD, 100% distal RCA after takeoff of the PDA treated with DES (2.520 mm Synergy DES stent from distal RCA to PLA2, 2.2524 mm Synergy DES stent to PLA1) with transient dissection in the more distal PLA2 treated with PTCA. Post-cath, he was placed on aspirin and Brilinta along with Lipitor. Overnight, he did  have a single episode of 16  beats run of nonsustained VT in the a.m. of 7/26.  He was seen by cardiology service on 7/26 at which time he was chest pain-free and denies any significant shortness of breath. He ambulated with cardiac rehab well. Given the 16 beats run of nonsustained VT, we have discussed the options with the patient including monitoring for another day versus discharged later on the same day, he preferred to be discharged on the same day. Given the fact that he has been asymptomatic with no additional chest discomfort, this is not unreasonable. We kept the patient for an additional 10 hours on telemetry, he had no recurrent VT. He is deemed stable for discharge from cardiology perspective. I have emphasized on compliance with DAPT. We have given him 1 month free coupon prescription for Brilinta. I have also arranged for seven-day transition of care follow-up in our [facility]. However eventually he will likely need cardiology follow-up in [place] office.   Discharge Vitals Blood pressure 153/78, pulse 56, temperature 98.2 F (36.8 C), temperature source Oral, resp. rate 21, height  (1.803 m), weight 246 lb 11.1 oz (111.9 kg), SpO2 97 %.  Filed Weights   02/25/15 0302 02/25/15 2035 02/26/15 0001  Weight: 246 lb 12.8 oz (111.948 kg) 246 lb 11.1 oz (111.9 kg) 246 lb 11.1 oz (111.9 kg)    Labs  CBC  Recent Labs  02/23/15 1955 02/24/15 0106 02/25/15 0252 02/26/15 0332  WBC 6.9 8.2 8.4 8.0  NEUTROABS 4.3 5.5  --   --   HGB 13.9 13.0 14.8 13.7  HCT 40.4 38.0* 43.6 40.0  MCV 88.2 88.2 89.7 88.9  PLT 170 212 143* 154   Basic Metabolic Panel  Recent Labs  02/24/15 0106 02/26/15 0837  NA 139 136  K 4.0 3.7  CL 103 102  CO2 25 25  GLUCOSE 146* 153*  BUN 14 9  CREATININE 1.05 0.98  CALCIUM 8.5* 8.9  MG 1.9  --    Liver Function Tests  Recent Labs  02/23/15 1955 02/24/15 0106  AST 37 51*  ALT 39 36  ALKPHOS 57 54  BILITOT 0.6 0.5  PROT 7.0 6.1*  ALBUMIN 4.0 3.4*     Recent Labs  02/23/15 1955  LIPASE 30   Cardiac Enzymes  Recent Labs  02/24/15 0106 02/24/15 0650 02/24/15 1335  TROPONINI 3.45* 9.52* 8.83*   Hemoglobin A1C  Recent Labs  02/24/15 0106  HGBA1C 5.8*   Thyroid Function Tests  Recent Labs  02/24/15 0106  TSH 3.590    Disposition  Pt is being discharged home today in good condition.  Follow-up Plans & Appointments      Follow-up Information    Follow up with Tereso Newcomer, PA-C On 03/05/2015.   Specialties:  Physician Assistant, Radiology, Interventional Cardiology   Why:  :30am   Contact information:   [address] [place] [address] [phone]       Discharge Medications    Medication List    STOP taking these medications        ibuprofen 200 MG tablet  Commonly known as:  ADVIL,MOTRIN      TAKE these medications        aspirin 81 MG chewable tablet  Chew 81 mg by mouth once.     atorvastatin 80 MG tablet  Commonly known as:  LIPITOR  Take 1 tablet (80 mg total) by mouth daily at 6 PM.  calcium carbonate 500 MG chewable tablet  Commonly known as:  TUMS - dosed in mg elemental calcium  Chew 2 tablets by mouth as needed for indigestion or heartburn.     metoprolol tartrate 25 MG tablet  Commonly known as:  LOPRESSOR  Take 0.5 tablets (12.5 mg total) by mouth 2 (two) times daily.     nitroGLYCERIN 0.4 MG SL tablet  Commonly known as:  NITROSTAT  Place 1 tablet (0.4 mg total) under the tongue every 5 (five) minutes x 3 doses as needed for chest pain.     ramipril 2.5 MG capsule  Commonly known as:  ALTACE  Take 1 capsule (2.5 mg total) by mouth daily.     ticagrelor 90 MG Tabs tablet  Commonly known as:  BRILINTA  Take 1 tablet (90 mg total) by mouth 2 (two) times daily.        Duration of Discharge Encounter   Greater than 30 minutes including physician time.  Ramond Dial PA-C Pager: [phone] 02/26/2015, 3:14 PM Patient seen and  examined. I agree with the assessment and plan as detailed above. See also my additional thoughts below.   Patient is ready for discharge. I made the decision for discharge. Refer also to the progress note from today. I agree with the discharge summary as outlined above and the plans that are made.  Willa Rough, MD, [facility] 02/26/2015 5:19 PM

## 2015-03-04 NOTE — Progress Notes (Signed)
Cardiology Office Note   Date:  03/05/2015   ID:  Dennis Morrow, DOB 1960-08-07, MRN [id number]  PCP:  [name], MD  Cardiologist:  [name]  >> requests FU in [facility]   Chief Complaint  Patient presents with  . Hospitalization Follow-up    NSTEMI >> s/p DES x 2 to distal RCA  . Coronary Artery Disease     History of Present Illness: Dennis Morrow is a 54 y.o. male with a hx of HTN, HL.   Admitted 7/23-7/26 with NSTEMI.  Initially presented to [facility] and tx to [facility].  LHC 02/25/2015 which showed 50% mid LAD, 100% distal RCA after takeoff of the PDA treated with DES (2.520 mm Synergy DES stent from distal RCA to PLA2, 2.2524 mm Synergy DES stent to PLA1) with transient dissection in the more distal PLA2 treated with PTCA. Post PCI course notable for 16 beats of NSVT.  He was kept for an additional 10 hours on telemetry and he had no recurrent VT.   He returns for FU.  Since DC, he has been doing well.  The patient denies chest pain, shortness of breath, syncope, orthopnea, PND or significant pedal edema.  He has had some left leg numbness since leaving the hospital. Denies weakness or pain. He does have a history of low back problems. He had many questions today about diet and activity restrictions. I answered all those for him today.   Studies/Reports Reviewed Today:  LHC/PCI 02/25/15 LAD: mid 50% LCx:  ok RCA: RPDA 85%, R Post AVB 100% EF normal PCI:  Synergy DES x 2 to distal RCA (RPDA and R post AVB) Preserved global LV contractility without focal segmental wall motion abnormalities, ejection fraction 55% 2 vessel cardiac disease with 50% proximal LAD stenosis; normal left circumflex coronary artery; and dominant RCA with total occlusion in the distal RCA continuation branch after the takeoff of the PDA vessel with TIMI 0 flow and very faint left-to-right collaterals seen to supply 2 vessels, and diffuse 90% stenoses in the mid PDA vessel. Difficult but  successful intervention to the RCA with ultimate PTCA/stenting of the distal RCA extending into the PLA 2 vessel with a 2.520 mm Synergy DES stent with the 100% occlusion being reduced to 0% and with transient dissection in the more distal PLA 2 treated with PTCA; PTCA of the PLA 1 vessel with 100% occlusion being reduced to 30% proximally and PTCA/DES stenting of the diffuse 90% PDA vessel stenoses being reduced to 0% with ultimate insertion of a 2.2524 mm Synergy DES stent.  Echo 02/25/15 - mild LVH. EF  50% to 55%.  Wall motion was normal;  Left ventricular diastolic function parameterswere normal. - Left atrium: The atrium was mildly dilated.   Past Medical History  Diagnosis Date  . Hypertension   . Hypercholesterolemia   . CAD (coronary artery disease)     cath 02/25/2015 50% mid LAD, 100% distal RCA after takeoff of the PDA treated with DES (2.520 mm Synergy DES stent from distal RCA to PLA2, 2.2524 mm Synergy DES stent to PLA1) with transient dissection in the more distal PLA2 treated with PTCA.  . Bradycardia     Past Surgical History  Procedure Laterality Date  . Tonsillectomy    . Cardiac catheterization N/A 02/25/2015    Procedure: Left Heart Cath and Coronary Angiography;  Surgeon: [name], MD;  Location: [facility] INVASIVE CV LAB;  Service: Cardiovascular;  Laterality: N/A;  . Cardiac  catheterization N/A 02/25/2015    Procedure: Coronary Stent Intervention;  Surgeon: [name], MD;  Location: [facility] INVASIVE CV LAB;  Service: Cardiovascular;  Laterality: N/A;     Current Outpatient Prescriptions  Medication Sig Dispense Refill  . aspirin EC 81 MG EC tablet Take 1 tablet (81 mg total) by mouth daily.    Marland Kitchen atorvastatin (LIPITOR) 80 MG tablet Take 1 tablet (80 mg total) by mouth daily at 6 PM. 30 tablet 11  . calcium carbonate (TUMS - DOSED IN MG ELEMENTAL CALCIUM) 500 MG chewable tablet Chew 2 tablets by mouth as needed for indigestion or heartburn.    . metoprolol  tartrate (LOPRESSOR) 25 MG tablet Take 0.5 tablets (12.5 mg total) by mouth 2 (two) times daily. 30 tablet 5  . Multiple Vitamin (MULTI VITAMIN MENS PO) Take 1 tablet by mouth daily.    . nitroGLYCERIN (NITROSTAT) 0.4 MG SL tablet Place 1 tablet (0.4 mg total) under the tongue every 5 (five) minutes x 3 doses as needed for chest pain. 25 tablet 3  . ramipril (ALTACE) 2.5 MG capsule Take 1 capsule (2.5 mg total) by mouth daily. 30 capsule 5  . ticagrelor (BRILINTA) 90 MG TABS tablet Take 1 tablet (90 mg total) by mouth 2 (two) times daily. 60 tablet 11   No current facility-administered medications for this visit.    Allergies:   Review of patient's allergies indicates no known allergies.    Social History:  The patient  reports that he has never smoked. He does not have any smokeless tobacco history on file. He reports that he drinks alcohol. He reports that he does not use illicit drugs.   Family History:  The patient's family history includes CVA in his mother.    ROS:   Please see the history of present illness.   Review of Systems  Hematologic/Lymphatic: Negative for bleeding problem.  Neurological: Positive for paresthesias.       L thigh - no back pain; no leg pain  All other systems reviewed and are negative.    PHYSICAL EXAM: VS:  BP 120/74 mmHg  Pulse 65  Ht 5\' 11"  (1.803 m)  Wt 245 lb 6.4 oz (111.313 kg)  BMI 34.24 kg/m2    Wt Readings from Last 3 Encounters:  03/05/15 245 lb 6.4 oz (111.313 kg)  02/26/15 246 lb 11.1 oz (111.9 kg)     GEN: Well nourished, well developed, in no acute distress HEENT: normal Neck: no JVD,  no masses Cardiac:  Normal S1/S2, RRR; no murmur ,  no rubs or gallops, no edema;  right wrist without hematoma or mass  Respiratory:  clear to auscultation bilaterally, no wheezing, rhonchi or rales. GI: soft, nontender, nondistended, + BS MS: no deformity or atrophy Skin: warm and dry  Neuro:  CNs II-XII intact, Strength and sensation are  intact Psych: Normal affect   EKG:  EKG is ordered today.  It demonstrates:   NSR, HR 65, inferior Q waves, T-wave inversions in 3, aVF, no significant change when compared to prior tracings   Recent Labs: 02/24/2015: ALT 36; Magnesium 1.9; TSH 3.590 02/26/2015: Hemoglobin 13.7; Platelets 154 03/05/2015: BUN 19; Creatinine, Ser 0.95; Potassium 4.1; Sodium 136    Lipid Panel No results found for: CHOL, TRIG, HDL, CHOLHDL, VLDL, LDLCALC, LDLDIRECT    ASSESSMENT AND PLAN:  Coronary artery disease involving native coronary artery of native heart without angina pectoris:  Doing well since recent non-STEMI treated with a DES 2 to the  distal RCA. LV function normal on echocardiogram. He has already been contacted by cardiac rehabilitation at [facility].  He thinks he will enroll.  We discussed the importance of dual antiplatelet therapy.  Continue ASA, Brilinta, ACE inhibitor, beta-blocker, statin.  Essential hypertension:  Controlled. Check BMET today.  Hyperlipidemia:   Check Lipids and LFTs in 6 weeks.    Leg Paresthesias:  FU with PCP.      Medication Changes: Current medicines are reviewed at length with the patient today.  Concerns regarding medicines are as outlined above.  The following changes have been made:   Discontinued Medications   No medications on file   Modified Medications   No medications on file   New Prescriptions   No medications on file    Labs/ tests ordered today include:   Orders Placed This Encounter  Procedures  . Hepatic function panel  . Lipid Profile  . Basic Metabolic Panel (BMET)     Disposition:   FU with one of our Cardiologists in [place], Brynda Rim, MHS 03/05/2015 9:11 PM    [facility] [address] [place], [place], [place]  [address] Phone: [phone]; Fax: [phone]

## 2015-03-05 ENCOUNTER — Encounter: Payer: Self-pay | Admitting: Physician Assistant

## 2015-03-05 ENCOUNTER — Encounter: Payer: Self-pay | Admitting: *Deleted

## 2015-03-05 ENCOUNTER — Ambulatory Visit (INDEPENDENT_AMBULATORY_CARE_PROVIDER_SITE_OTHER): Payer: 59 | Admitting: Physician Assistant

## 2015-03-05 VITALS — BP 120/74 | HR 65 | Ht 71.0 in | Wt 245.4 lb

## 2015-03-05 DIAGNOSIS — I10 Essential (primary) hypertension: Secondary | ICD-10-CM

## 2015-03-05 DIAGNOSIS — Z006 Encounter for examination for normal comparison and control in clinical research program: Secondary | ICD-10-CM

## 2015-03-05 DIAGNOSIS — I2510 Atherosclerotic heart disease of native coronary artery without angina pectoris: Secondary | ICD-10-CM | POA: Diagnosis not present

## 2015-03-05 DIAGNOSIS — E785 Hyperlipidemia, unspecified: Secondary | ICD-10-CM

## 2015-03-05 LAB — BASIC METABOLIC PANEL
BUN: 19 mg/dL (ref 6–23)
CALCIUM: 9.7 mg/dL (ref 8.4–10.5)
CHLORIDE: 102 meq/L (ref 96–112)
CO2: 27 meq/L (ref 19–32)
Creatinine, Ser: 0.95 mg/dL (ref 0.40–1.50)
GFR: 87.28 mL/min (ref >60.00)
Glucose, Bld: 108 mg/dL — ABNORMAL HIGH (ref 70–99)
POTASSIUM: 4.1 meq/L (ref 3.5–5.1)
Sodium: 136 mEq/L (ref 135–145)

## 2015-03-05 NOTE — Progress Notes (Signed)
Dal-Gene Study Trial   All elements of the informed consent form,study requirements and expectations were reviewed with the patient, and all questions and concerns were Identified and addressed prior to the signing of the consent. No procedures were performed prior to consenting the patient. The patient was given an  adequate amount of time to make an informed decision.A copy of the consent was provided to the patient to take home.

## 2015-03-05 NOTE — Patient Instructions (Addendum)
Medication Instructions:  Your physician recommends that you continue on your current medications as directed. Please refer to the Current Medication list given to you today.   Labwork: BMET TODAY  LFT & Lipids in [place]  Testing/Procedures: NONE  Follow-Up: Your physician wants you to follow-up in: 6/8 weeks in [place].    Any Other Special Instructions Will Be Listed Below (If Applicable). NONE

## 2015-03-05 NOTE — Research (Signed)
Dennis Morrow Gene Study Informed Consent  All elements of the informed consent form, study requirements and expectations were reviewed with the patient, and all questions and concerns were identified and addressed prior to the signing of the consent. No procedures were performed prior to consenting the patient. The patient was given an adequate amount of time to make an informed decision. A copy of the consent was provided to the patient to take home.

## 2015-03-06 ENCOUNTER — Telehealth: Payer: Self-pay | Admitting: *Deleted

## 2015-03-06 NOTE — Telephone Encounter (Signed)
Beatrice Lecher, PA-C  Sent: Tue March 05, 2015 9:49 PM   To: Tarri Fuller, CMA   Result Note    Potassium and kidney function ok   Continue with current treatment plan.   Tereso Newcomer, PA-C    03/05/2015 9:49 PM  Pt aware

## 2015-03-08 ENCOUNTER — Encounter: Payer: Self-pay | Admitting: *Deleted

## 2015-03-08 DIAGNOSIS — Z006 Encounter for examination for normal comparison and control in clinical research program: Secondary | ICD-10-CM

## 2015-03-08 NOTE — Progress Notes (Signed)
Phone call from patient (after unsuccessful attempts)  to inform the results for [facility] Research study. [id number] genotype negative and patient does not qualify for study. I thanked patient for his willingness to participate. Questions encouraged and answered.

## 2015-03-22 ENCOUNTER — Other Ambulatory Visit: Payer: Self-pay | Admitting: Adult Health

## 2015-03-22 NOTE — Telephone Encounter (Signed)
Mr. Lazar needs to have ticagrelor (BRILINTA) 90 MG TABS tablet refilled But can not get the next refill on ticagrelor (BRILINTA) 90 MG TABS tablet until his Insurance company with [facility] has an authorization # from our office. Please call # [phone] [facility]) to obtain the correct information for authorization. Please call patient when this is complete # [phone].

## 2015-03-22 NOTE — Telephone Encounter (Signed)
PA sent through [facility] have 72 hrs to approve

## 2015-03-26 NOTE — Telephone Encounter (Signed)
Patient notified via private voice message that Marden Noble has been approved for 1 year.

## 2015-04-10 ENCOUNTER — Telehealth: Payer: Self-pay | Admitting: Physician Assistant

## 2015-04-10 NOTE — Telephone Encounter (Signed)
I will fax today an order for LIPID AND LIVER PANEL to Kayla at  [facility] in [place] per phone message today. I s/w Kayla at [facility] and she has been made aware of Rx being faxed today.

## 2015-04-10 NOTE — Telephone Encounter (Signed)
New problem   Need order fax to [phone] for liver functions and lipids that scott had ordered. Please advise.

## 2015-04-16 ENCOUNTER — Ambulatory Visit: Payer: 59 | Admitting: Adult Health

## 2015-04-17 ENCOUNTER — Ambulatory Visit (INDEPENDENT_AMBULATORY_CARE_PROVIDER_SITE_OTHER): Payer: 59 | Admitting: Physician Assistant

## 2015-04-17 ENCOUNTER — Encounter: Payer: Self-pay | Admitting: Physician Assistant

## 2015-04-17 VITALS — BP 140/92 | HR 68 | Ht 71.0 in | Wt 237.0 lb

## 2015-04-17 DIAGNOSIS — E785 Hyperlipidemia, unspecified: Secondary | ICD-10-CM

## 2015-04-17 DIAGNOSIS — I10 Essential (primary) hypertension: Secondary | ICD-10-CM | POA: Diagnosis not present

## 2015-04-17 DIAGNOSIS — I2510 Atherosclerotic heart disease of native coronary artery without angina pectoris: Secondary | ICD-10-CM | POA: Diagnosis not present

## 2015-04-17 MED ORDER — RAMIPRIL 5 MG PO CAPS: 5.0000 mg | ORAL_CAPSULE | Freq: Every day | ORAL | Status: DC

## 2015-04-17 NOTE — Progress Notes (Signed)
Cardiology Office Note   Date:  04/17/2015   ID:  DAVYD PODGORSKI, DOB 1960/09/16, MRN [id number]  PCP:  [name], MD  Cardiologist:  Previous [name], patient requests follow-up in   Chief Complaint:    History of Present Illness: Dennis Morrow is a 54 y.o. male who presents for follow-up. Admitted 7/23-7/26 with NSTEMI. Initially presented to [facility] and tx to [facility]. LHC 02/25/2015 which showed 50% mid LAD, 100% distal RCA after takeoff of the PDA treated with DES (2.520 mm Synergy DES stent from distal RCA to PLA2, 2.2524 mm Synergy DES stent to PLA1) with transient dissection in the more distal PLA2 treated with PTCA. Post PCI course notable for 16 beats of NSVT. He was kept for an additional 10 hours on telemetry and he had no recurrent VT. EF was 50-55% at cath. He saw Tereso Newcomer, PA-C 03/05/15 at which time he was doing well. No changes were made.  Overall the patient is doing well. 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 His blood pressure is up a little bit. He is doing well with his diet and has lost 16 pounds. His most recent lipid profile was excellent. He did forget to take his evening dose of medications on Monday including his Brilinta. This is the only dose he is missed. He never started cardiac rehabilitation and is wondering if he needs it. He is exercising in a pool and actually hurt his back doing flips off the diving board. He is seeing chiropractor for this. He also complains of tingling along his left thigh since hospitalization. This improved some after his chiropractor visit. He denies any chest pain, palpitations, dyspnea, dyspnea on exertion, dizziness or presyncope.    Past Medical History  Diagnosis Date  . Hypertension   . Hypercholesterolemia   . CAD (coronary artery disease)     cath 02/25/2015 50% mid LAD, 100% distal RCA after takeoff of the PDA treated with DES (2.520 mm  Synergy DES stent from distal RCA to PLA2, 2.2524 mm Synergy DES stent to PLA1) with transient dissection in the more distal PLA2 treated with PTCA.  . Bradycardia     Past Surgical History  Procedure Laterality Date  . Tonsillectomy    . Cardiac catheterization N/A 02/25/2015    Procedure: Left Heart Cath and Coronary Angiography;  Surgeon: [name], MD;  Location: [facility] INVASIVE CV LAB;  Service: Cardiovascular;  Laterality: N/A;  . Cardiac catheterization N/A 02/25/2015    Procedure: Coronary Stent Intervention;  Surgeon: [name], MD;  Location: [facility] INVASIVE CV LAB;  Service: Cardiovascular;  Laterality: N/A;     Current Outpatient Prescriptions  Medication Sig Dispense Refill  . aspirin EC 81 MG EC tablet Take 1 tablet (81 mg total) by mouth daily.    Marland Kitchen atorvastatin (LIPITOR) 80 MG tablet Take 1 tablet (80 mg total) by mouth daily at 6 PM. 30 tablet 11  . calcium carbonate (TUMS - DOSED IN MG ELEMENTAL CALCIUM) 500 MG chewable tablet Chew 2 tablets by mouth as needed for indigestion or heartburn.    . metoprolol tartrate (LOPRESSOR) 25 MG tablet Take 0.5 tablets (12.5 mg total) by mouth 2 (two) times daily. 30 tablet 5  . Multiple Vitamin (MULTI VITAMIN MENS PO) Take 1 tablet by mouth daily.    . nitroGLYCERIN (NITROSTAT) 0.4 MG SL tablet Place 1 tablet (0.4 mg total) under the  tongue every 5 (five) minutes x 3 doses as needed for chest pain. 25 tablet 3  . ramipril (ALTACE) 2.5 MG capsule Take 1 capsule (2.5 mg total) by mouth daily. 30 capsule 5  . ticagrelor (BRILINTA) 90 MG TABS tablet Take 1 tablet (90 mg total) by mouth 2 (two) times daily. 60 tablet 11   No current facility-administered medications for this visit.    Allergies:   Review of patient's allergies indicates no known allergies.    Social History:  The patient  reports that he has never smoked. He has never used smokeless tobacco. He reports that he drinks alcohol. He reports that he does not use illicit  drugs.   Family History:  The patient's family history includes CVA in his mother.    ROS:  Please see the history of present illness.   Otherwise, review of systems are positive for none.   All other systems are reviewed and negative.    PHYSICAL EXAM: VS:  BP 140/92 mmHg  Pulse 68  Ht 5\' 11"  (1.803 m)  Wt 237 lb (107.502 kg)  BMI 33.07 kg/m2  SpO2 98% , BMI Body mass index is 33.07 kg/(m^2). GEN: Well nourished, well developed, in no acute distress Neck: no JVD, HJR, carotid bruits, or masses Cardiac: RRR; positive S4 no murmurs, rubs, thrill or heave,  Respiratory:  clear to auscultation bilaterally, normal work of breathing GI: soft, nontender, nondistended, + BS MS: no deformity or atrophy Extremities: without cyanosis, clubbing, edema, good distal pulses bilaterally.  Skin: warm and dry, no rash Neuro:  Strength and sensation are intact    EKG:  EKG is not ordered today.    Recent Labs: 02/24/2015: ALT 36; Magnesium 1.9; TSH 3.590 02/26/2015: Hemoglobin 13.7; Platelets 154 03/05/2015: BUN 19; Creatinine, Ser 0.95; Potassium 4.1; Sodium 136    Lipid Panel No results found for: CHOL, TRIG, HDL, CHOLHDL, VLDL, LDLCALC, LDLDIRECT    Wt Readings from Last 3 Encounters:  04/17/15 237 lb (107.502 kg)  03/05/15 245 lb 6.4 oz (111.313 kg)  02/26/15 246 lb 11.1 oz (111.9 kg)      Other studies Reviewed: Additional studies/ records that were reviewed today include and review of the records demonstrates:  Lipid profile at he brought along is excellent cholesterol 133 LDL 57   LHC/PCI 02/25/15 LAD: mid 50% LCx:  ok RCA: RPDA 85%, R Post AVB 100% EF normal PCI:  Synergy DES x 2 to distal RCA (RPDA and R post AVB) Preserved global LV contractility without focal segmental wall motion abnormalities, ejection fraction 55% 2 vessel cardiac disease with 50% proximal LAD stenosis; normal left circumflex coronary artery; and dominant RCA with total occlusion in the distal RCA  continuation branch  after the takeoff of the PDA vessel with TIMI 0 flow and very faint left-to-right collaterals seen to supply 2 vessels, and diffuse 90% stenoses in the mid PDA vessel. Difficult but successful intervention to the RCA with ultimate PTCA/stenting of the distal RCA extending into the PLA 2 vessel with a 2.520 mm Synergy DES stent with the 100% occlusion being reduced to 0% and with transient dissection in the more distal PLA 2 treated with PTCA;  PTCA  of the PLA 1 vessel with 100% occlusion being reduced to 30% proximally and PTCA/DES stenting of the diffuse 90% PDA vessel stenoses being reduced to 0% with ultimate insertion of a 2.2524 mm Synergy DES stent.  Echo 02/25/15 - mild LVH. EF  50% to 55%.  Wall motion  was normal;  Left ventricular diastolic function parameters were normal. - Left atrium: The atrium was mildly dilated.   ASSESSMENT AND PLAN:  CAD (coronary artery disease) Patient had recent MI treated with drug-eluting stent to the distal RCA extending into the PLA to. PTCA of the PLA 1 and drug-eluting stent to the PDA. Patient is doing well after his MI. He has lost 16 pounds. He is really adjusted his diet and continues to watch closely. 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 He probably does not need to go to cardiac rehabilitation at this point. Continue metoprolol, aspirin, Brilinta and Lipitor. Stressed importance of not missing a dose. Follow-up with cardiologist in 2 months. To be established.  Essential hypertension Patient's blood pressure is elevated today. Increase ramipril to 5 mg daily. 2 g sodium diet.  Hyperlipidemia Excellent lipid profile on Lipitor. No changes.    Signed, Jacolyn Reedy, PA-C  04/17/2015 1:53 PM    [facility] [address] [place], [place], [place]  [address] Phone: [phone]; Fax: [phone]

## 2015-04-17 NOTE — Assessment & Plan Note (Signed)
Excellent lipid profile on Lipitor. No changes.

## 2015-04-17 NOTE — Assessment & Plan Note (Signed)
Patient's blood pressure is elevated today. Increase ramipril to 5 mg daily. 2 g sodium diet.

## 2015-04-17 NOTE — Patient Instructions (Signed)
Your physician recommends that you schedule a follow-up appointment in:  2 Months  Your physician has recommended you make the following change in your medication:   Increase Ramipril ( Altace) to 5 mg Daily   Thank you for choosing [facility]! ' Low-Sodium Eating Plan Sodium raises blood pressure and causes water to be held in the body. Getting less sodium from food will help lower your blood pressure, reduce any swelling, and protect your heart, liver, and kidneys. We get sodium by adding salt (sodium chloride) to food. Most of our sodium comes from canned, boxed, and frozen foods. Restaurant foods, fast foods, and pizza are also very high in sodium. Even if you take medicine to lower your blood pressure or to reduce fluid in your body, getting less sodium from your food is important. WHAT IS MY PLAN? Most people should limit their sodium intake to 2,300 mg a day. Your health care provider recommends that you limit your sodium intake to ____2000mg  ______ a day.  WHAT DO I NEED TO KNOW ABOUT THIS EATING PLAN? For the low-sodium eating plan, you will follow these general guidelines:  Choose foods with a % Daily Value for sodium of less than 5% (as listed on the food label).   Use salt-free seasonings or herbs instead of table salt or sea salt.   Check with your health care provider or pharmacist before using salt substitutes.   Eat fresh foods.  Eat more vegetables and fruits.  Limit canned vegetables. If you do use them, rinse them well to decrease the sodium.   Limit cheese to 1 oz (28 g) per day.   Eat lower-sodium products, often labeled as "lower sodium" or "no salt added."  Avoid foods that contain monosodium glutamate (MSG). MSG is sometimes added to [place] food and some canned foods.  Check food labels (Nutrition Facts labels) on foods to learn how much sodium is in one serving.  Eat more home-cooked food and less restaurant, buffet, and fast  food.  When eating at a restaurant, ask that your food be prepared with less salt or none, if possible.  HOW DO I READ FOOD LABELS FOR SODIUM INFORMATION? The Nutrition Facts label lists the amount of sodium in one serving of the food. If you eat more than one serving, you must multiply the listed amount of sodium by the number of servings. Food labels may also identify foods as:  Sodium free--Less than 5 mg in a serving.  Very low sodium--35 mg or less in a serving.  Low sodium--140 mg or less in a serving.  Light in sodium--50% less sodium in a serving. For example, if a food that usually has 300 mg of sodium is changed to become light in sodium, it will have 150 mg of sodium.  Reduced sodium--25% less sodium in a serving. For example, if a food that usually has 400 mg of sodium is changed to reduced sodium, it will have 300 mg of sodium. WHAT FOODS CAN I EAT? Grains Low-sodium cereals, including oats, puffed wheat and rice, and shredded wheat cereals. Low-sodium crackers. Unsalted rice and pasta. Lower-sodium bread.  Vegetables Frozen or fresh vegetables. Low-sodium or reduced-sodium canned vegetables. Low-sodium or reduced-sodium tomato sauce and paste. Low-sodium or reduced-sodium tomato and vegetable juices.  Fruits Fresh, frozen, and canned fruit. Fruit juice.  Meat and Other Protein Products Low-sodium canned tuna and salmon. Fresh or frozen meat, poultry, seafood, and fish. Lamb. Unsalted nuts. Dried beans, peas, and lentils without added  salt. Unsalted canned beans. Homemade soups without salt. Eggs.  Dairy Milk. Soy milk. Ricotta cheese. Low-sodium or reduced-sodium cheeses. Yogurt.  Condiments Fresh and dried herbs and spices. Salt-free seasonings. Onion and garlic powders. Low-sodium varieties of mustard and ketchup. Lemon juice.  Fats and Oils Reduced-sodium salad dressings. Unsalted butter.  Other Unsalted popcorn and pretzels.  The items listed above may  not be a complete list of recommended foods or beverages. Contact your dietitian for more options. WHAT FOODS ARE NOT RECOMMENDED? Grains Instant hot cereals. Bread stuffing, pancake, and biscuit mixes. Croutons. Seasoned rice or pasta mixes. Noodle soup cups. Boxed or frozen macaroni and cheese. Self-rising flour. Regular salted crackers. Vegetables Regular canned vegetables. Regular canned tomato sauce and paste. Regular tomato and vegetable juices. Frozen vegetables in sauces. Salted french fries. Olives. Rosita Fire. Relishes. Sauerkraut. Salsa. Meat and Other Protein Products Salted, canned, smoked, spiced, or pickled meats, seafood, or fish. Bacon, ham, sausage, hot dogs, corned beef, chipped beef, and packaged luncheon meats. Salt pork. Jerky. Pickled herring. Anchovies, regular canned tuna, and sardines. Salted nuts. Dairy Processed cheese and cheese spreads. Cheese curds. Blue cheese and cottage cheese. Buttermilk.  Condiments Onion and garlic salt, seasoned salt, table salt, and sea salt. Canned and packaged gravies. Worcestershire sauce. Tartar sauce. Barbecue sauce. Teriyaki sauce. Soy sauce, including reduced sodium. Steak sauce. Fish sauce. Oyster sauce. Cocktail sauce. Horseradish. Regular ketchup and mustard. Meat flavorings and tenderizers. Bouillon cubes. Hot sauce. Tabasco sauce. Marinades. Taco seasonings. Relishes. Fats and Oils Regular salad dressings. Salted butter. Margarine. Ghee. Bacon fat.  Other Potato and tortilla chips. Corn chips and puffs. Salted popcorn and pretzels. Canned or dried soups. Pizza. Frozen entrees and pot pies.  The items listed above may not be a complete list of foods and beverages to avoid. Contact your dietitian for more information. Document Released: 01/09/2002 Document Revised: 07/25/2013 Document Reviewed: 05/24/2013 [facility] Patient Information 2015 [place], [place]. This information is not intended to replace advice given to you by your  health care provider. Make sure you discuss any questions you have with your health care provider.

## 2015-04-17 NOTE — Assessment & Plan Note (Addendum)
Patient had recent MI treated with drug-eluting stent to the distal RCA extending into the PLA to. PTCA of the PLA 1 and drug-eluting stent to the PDA. Patient is doing well after his MI. He has lost 16 pounds. He is really adjusted his diet and continues to watch closely. 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 He probably does not need to go to cardiac rehabilitation at this point. Continue metoprolol, aspirin, Brilinta and Lipitor. Stressed importance of not missing a dose. Follow-up with cardiologist in 2 months. To be established.

## 2015-05-03 ENCOUNTER — Encounter: Payer: Self-pay | Admitting: Physician Assistant

## 2015-06-14 ENCOUNTER — Ambulatory Visit: Payer: 59 | Admitting: Cardiovascular Disease

## 2015-07-12 ENCOUNTER — Telehealth: Payer: Self-pay | Admitting: Cardiovascular Disease

## 2015-07-12 NOTE — Telephone Encounter (Signed)
Pt is scheduled to see Dr. Kirtland BouchardK on Monday and he's wondering if he will ned to have lab work done prior to his apt

## 2015-07-12 NOTE — Telephone Encounter (Signed)
Spoke with pt all questions answered. Pt voiced understanding.

## 2015-07-15 ENCOUNTER — Ambulatory Visit (INDEPENDENT_AMBULATORY_CARE_PROVIDER_SITE_OTHER): Payer: 59 | Admitting: Cardiovascular Disease

## 2015-07-15 ENCOUNTER — Encounter: Payer: Self-pay | Admitting: Cardiovascular Disease

## 2015-07-15 VITALS — BP 132/72 | HR 63 | Ht 71.0 in | Wt 248.2 lb

## 2015-07-15 DIAGNOSIS — E785 Hyperlipidemia, unspecified: Secondary | ICD-10-CM | POA: Diagnosis not present

## 2015-07-15 DIAGNOSIS — I2510 Atherosclerotic heart disease of native coronary artery without angina pectoris: Secondary | ICD-10-CM

## 2015-07-15 DIAGNOSIS — I10 Essential (primary) hypertension: Secondary | ICD-10-CM

## 2015-07-15 DIAGNOSIS — Z7189 Other specified counseling: Secondary | ICD-10-CM

## 2015-07-15 DIAGNOSIS — M25562 Pain in left knee: Secondary | ICD-10-CM

## 2015-07-15 DIAGNOSIS — Z7182 Exercise counseling: Secondary | ICD-10-CM

## 2015-07-15 DIAGNOSIS — M25561 Pain in right knee: Secondary | ICD-10-CM

## 2015-07-15 MED ORDER — ATORVASTATIN CALCIUM 40 MG PO TABS: 40.0000 mg | ORAL_TABLET | Freq: Every day | ORAL | Status: DC

## 2015-07-15 NOTE — Patient Instructions (Signed)
Your physician wants you to follow-up in: 6 months with Dr Reggy EyeKoneswaran You will receive a reminder letter in the mail two months in advance. If you don't receive a letter, please call our office to schedule the follow-up appointment.    DECREASE Lipitor to 40 mg daily    Get lab work Lipids FASTING JUST BEFORE NEXT VISIT    Thank you for choosing [facility] !

## 2015-07-15 NOTE — Progress Notes (Signed)
Patient ID: Dennis Morrow, male   DOB: Feb 20, 1961, 54 y.o.   MRN: [id number]      SUBJECTIVE: Mr. Dennis Morrow presents for routine follow-up. This is my first time meeting him. He was hospitalized from 7/23-7/26/16 with a NSTEMI. [facility]. LHC 02/25/2015 which showed 50% mid LAD, 100% distal RCA after takeoff of the PDA treated with DES (2.520 mm Synergy DES stent from distal RCA to PLA2, 2.2524 mm Synergy DES stent to PLA1) with transient dissection in the more distal PLA2 treated with PTCA. Post PCI course notable for 16 beats of NSVT. He was kept for an additional 10 hours on telemetry and he had no recurrent VT. EF was 50-55% at cath. He saw Jacolyn ReedyMichele Lenze, PA-C 04/17/15 at which time he was doing well.  Echocardiogram on 02/25/15 demonstrated normal left ventricular systolic and diastolic function, EF 50-55% , with normal regional wall motion.  Lipids on 04/10/15 showed total cholesterol 133, triglycerides 127, HDL 51, LDL 57.  He has several questions related to his medications as well as the treatment of arthritic knee pain. He denies exertional chest pain. He did not participate in cardiac rehabilitation because he says he stays very active at work climbing several flights of stairs and lifting heavy objects daily.  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 He attributes his knee pain to this. He used to stay active in a swimming pool at the country club before it closed for the season.  He took Crestor several years ago but it led to joint pains and muscle aches. [facility], he has been taking Lipitor 80 mg every other day and his diffuse arthralgias have resolved.   Review of Systems: As per "subjective", otherwise negative.  No Known Allergies  Current Outpatient Prescriptions  Medication Sig Dispense Refill  . aspirin EC 81 MG EC tablet Take 1 tablet (81 mg total) by mouth  daily.    Marland Kitchen. atorvastatin (LIPITOR) 80 MG tablet Take 1 tablet (80 mg total) by mouth daily at 6 PM. 30 tablet 11  . calcium carbonate (TUMS - DOSED IN MG ELEMENTAL CALCIUM) 500 MG chewable tablet Chew 2 tablets by mouth as needed for indigestion or heartburn.    . metoprolol tartrate (LOPRESSOR) 25 MG tablet Take 0.5 tablets (12.5 mg total) by mouth 2 (two) times daily. 30 tablet 5  . Multiple Vitamin (MULTI VITAMIN MENS PO) Take 1 tablet by mouth daily.    . nitroGLYCERIN (NITROSTAT) 0.4 MG SL tablet Place 1 tablet (0.4 mg total) under the tongue every 5 (five) minutes x 3 doses as needed for chest pain. 25 tablet 3  . ramipril (ALTACE) 5 MG capsule Take 1 capsule (5 mg total) by mouth daily. 30 capsule 11  . ticagrelor (BRILINTA) 90 MG TABS tablet Take 1 tablet (90 mg total) by mouth 2 (two) times daily. 60 tablet 11   No current facility-administered medications for this visit.    Past Medical History  Diagnosis Date  . Hypertension   . Hypercholesterolemia   . CAD (coronary artery disease)     cath 02/25/2015 50% mid LAD, 100% distal RCA after takeoff of the PDA treated with DES (2.520 mm Synergy DES stent from distal RCA to PLA2, 2.2524 mm Synergy DES stent to PLA1) with transient dissection in the more distal PLA2 treated with PTCA.  . Bradycardia     Past  Surgical History  Procedure Laterality Date  . Tonsillectomy    . Cardiac catheterization N/A 02/25/2015    Procedure: Left Heart Cath and Coronary Angiography;  Surgeon: [name], MD;  Location: [facility] INVASIVE CV LAB;  Service: Cardiovascular;  Laterality: N/A;  . Cardiac catheterization N/A 02/25/2015    Procedure: Coronary Stent Intervention;  Surgeon: [name], MD;  Location: [facility] INVASIVE CV LAB;  Service: Cardiovascular;  Laterality: N/A;    Social History   Social History  . Marital Status: Significant Other    Spouse Name: N/A  . Number of Children: N/A  . Years of Education: N/A   Occupational History  .  Not on file.   Social History Main Topics  . Smoking status: Never Smoker   . Smokeless tobacco: Never Used  . Alcohol Use: 0.0 oz/week    0 Standard drinks or equivalent per week     Comment: socially  . Drug Use: No  . Sexual Activity: Not on file   Other Topics Concern  . Not on file   Social History Narrative     Filed Vitals:   07/15/15 1606  BP: 132/72  Pulse: 63  Height:  (1.803 m)  Weight: 248 lb 3.2 oz (112.583 kg)  SpO2: 98%    PHYSICAL EXAM General: NAD HEENT: Normal. Neck: No JVD, no thyromegaly. Lungs: Clear to auscultation bilaterally with normal respiratory effort. CV: Nondisplaced PMI.  Regular rate and rhythm, normal S1/S2, no S3/S4, no murmur. No pretibial or periankle edema.  No carotid bruit.    Abdomen: Soft, nontender, obese.  Neurologic: Alert and oriented x 3.  Psych: Normal affect. Skin: Normal. Musculoskeletal: No gross deformities. Extremities: No clubbing or cyanosis.   ECG: Most recent ECG reviewed.      ASSESSMENT AND PLAN: 1. CAD with h/o NSTEMI and RCA PCI: Stable ischemic heart disease. Continue ASA, Brilinta, metoprolol, ramipril, and Lipitor (will reduce to 40 mg daily due to arthralgias/myalgias).  2. Essential HTN: Controlled on ramipril. No changes.  3. Hyperlipidemia: Most recent results noted above. Will reduce Lipitor to 40 mg daily due to arthralgias/myalgias. Will repeat lipids in 6 months.  4. Bilateral knee pain: He asked about Celebrex. Used to take ibuprofen 800 mg regularly. Warned him about chronic NSAID use and increased incidence of cardiovascular events as well as GI inflammation/ulceration particularly with concomitant dual antiplatelet therapy. I recommended aqua therapy.  Dispo: f/u 6 months.  Time spent: 40 minutes, of which greater than 50% was spent reviewing symptoms, relevant blood tests and studies, and discussing management plan with the patient.   Prentice Docker, M.D., F.A.C.C.

## 2015-08-20 ENCOUNTER — Telehealth: Payer: Self-pay | Admitting: Cardiovascular Disease

## 2015-08-20 NOTE — Telephone Encounter (Signed)
Pt has switched ins companies and is now w/ [employer] instead of [employer], [employer] is requesting another prior auth for his Brilinta --pt uses [facility]

## 2015-08-20 NOTE — Telephone Encounter (Signed)
[facility] a request for prior authorization, but Dennis Morrow called to let me know that [facility] does not need a  Prior auth. For his brilinta under his new insurance,

## 2015-10-31 ENCOUNTER — Telehealth: Payer: Self-pay | Admitting: Cardiovascular Disease

## 2015-10-31 NOTE — Telephone Encounter (Signed)
Pt to bring income info by office and sign med assistance papers

## 2015-10-31 NOTE — Telephone Encounter (Signed)
Pt is paying over $200 for Brilinta w/ a discount and he's wondering if there is any assistance he qualifies for and if he could possibly get some samples

## 2015-11-11 ENCOUNTER — Telehealth: Payer: Self-pay | Admitting: Cardiovascular Disease

## 2015-11-11 NOTE — Telephone Encounter (Signed)
The patient came in to the office with a letter that he had gotten over the weekend stating that there was information missing from the assistance forms that were sent in.  He said that we should have put that information on the forms as we had the information here.  Please call him at [phone].

## 2015-11-12 NOTE — Telephone Encounter (Signed)
Patient approved for Brilinta assistance through November 11, 2016

## 2015-11-14 ENCOUNTER — Telehealth: Payer: Self-pay | Admitting: *Deleted

## 2015-11-14 NOTE — Telephone Encounter (Signed)
Pt notified that Pt assistance Brilinta 90 mg has arrived in the office and availed to be picked up.

## 2016-01-02 DIAGNOSIS — E785 Hyperlipidemia, unspecified: Secondary | ICD-10-CM | POA: Diagnosis not present

## 2016-01-02 LAB — LIPID PANEL
Cholesterol: 135 mg/dL (ref 125–200)
HDL: 61 mg/dL (ref >=40)
LDL CALC: 50 mg/dL (ref <130)
TRIGLYCERIDES: 122 mg/dL (ref <150)
Total CHOL/HDL Ratio: 2.2 Ratio (ref <=5.0)
VLDL: 24 mg/dL (ref <30)

## 2016-01-06 ENCOUNTER — Ambulatory Visit (INDEPENDENT_AMBULATORY_CARE_PROVIDER_SITE_OTHER): Payer: BLUE CROSS/BLUE SHIELD | Admitting: Cardiovascular Disease

## 2016-01-06 ENCOUNTER — Encounter: Payer: Self-pay | Admitting: Cardiovascular Disease

## 2016-01-06 VITALS — BP 116/66 | HR 68 | Ht 71.0 in | Wt 244.0 lb

## 2016-01-06 DIAGNOSIS — Z713 Dietary counseling and surveillance: Secondary | ICD-10-CM

## 2016-01-06 DIAGNOSIS — E785 Hyperlipidemia, unspecified: Secondary | ICD-10-CM

## 2016-01-06 DIAGNOSIS — Z7182 Exercise counseling: Secondary | ICD-10-CM

## 2016-01-06 DIAGNOSIS — I2510 Atherosclerotic heart disease of native coronary artery without angina pectoris: Secondary | ICD-10-CM | POA: Diagnosis not present

## 2016-01-06 DIAGNOSIS — I10 Essential (primary) hypertension: Secondary | ICD-10-CM

## 2016-01-06 DIAGNOSIS — Z7189 Other specified counseling: Secondary | ICD-10-CM

## 2016-01-06 MED ORDER — METOPROLOL TARTRATE 25 MG PO TABS: 12.5000 mg | ORAL_TABLET | Freq: Two times a day (BID) | ORAL | Status: DC

## 2016-01-06 MED ORDER — METOPROLOL TARTRATE 25 MG PO TABS: 25.0000 mg | ORAL_TABLET | Freq: Two times a day (BID) | ORAL | Status: DC

## 2016-01-06 NOTE — Addendum Note (Signed)
Addended by: Marlyn CorporalARLTON, Maitri Schnoebelen A on: 01/06/2016 11:49 AM   Modules accepted: Orders, Medications

## 2016-01-06 NOTE — Progress Notes (Signed)
Patient ID: Dennis Morrow, male   DOB: 1961-01-27, 55 y.o.   MRN: [id number]      SUBJECTIVE: The patient returns for routine follow-up for coronary artery disease. He was hospitalized from 7/23-7/26/16 with a NSTEMI.LHC 02/25/2015 showed 50% mid LAD, 100% distal RCA after takeoff of the PDA treated with DES (2.520 mm Synergy DES stent from distal RCA to PLA2, 2.2524 mm Synergy DES stent to PLA1) with transient dissection in the more distal PLA2 treated with PTCA. Echocardiogram on 02/25/15 demonstrated normal left ventricular systolic and diastolic function, EF 50-55% , with normal regional wall motion.  He again had several questions regarding each one of his medications. In the past month he has had some hand joint tightening but says he uses his hands quite a bit for his profession and he also plays a lot of golf. He denies hematuria and hematochezia. He also denies chest pain, leg swelling, and shortness of breath.   Review of Systems: As per "subjective", otherwise negative.  No Known Allergies  Current Outpatient Prescriptions  Medication Sig Dispense Refill  . aspirin EC 81 MG EC tablet Take 1 tablet (81 mg total) by mouth daily.    Marland Kitchen atorvastatin (LIPITOR) 40 MG tablet Take 1 tablet (40 mg total) by mouth daily. 90 tablet 3  . calcium carbonate (TUMS - DOSED IN MG ELEMENTAL CALCIUM) 500 MG chewable tablet Chew 2 tablets by mouth as needed for indigestion or heartburn.    . Multiple Vitamin (MULTI VITAMIN MENS PO) Take 1 tablet by mouth daily.    . nitroGLYCERIN (NITROSTAT) 0.4 MG SL tablet Place 1 tablet (0.4 mg total) under the tongue every 5 (five) minutes x 3 doses as needed for chest pain. 25 tablet 3  . ramipril (ALTACE) 5 MG capsule Take 1 capsule (5 mg total) by mouth daily. 30 capsule 11  . ticagrelor (BRILINTA) 90 MG TABS tablet Take 1 tablet (90 mg total) by mouth 2 (two) times daily. 60 tablet 11   No current facility-administered medications for this visit.    Past  Medical History  Diagnosis Date  . Hypertension   . Hypercholesterolemia   . CAD (coronary artery disease)     cath 02/25/2015 50% mid LAD, 100% distal RCA after takeoff of the PDA treated with DES (2.520 mm Synergy DES stent from distal RCA to PLA2, 2.2524 mm Synergy DES stent to PLA1) with transient dissection in the more distal PLA2 treated with PTCA.  . Bradycardia     Past Surgical History  Procedure Laterality Date  . Tonsillectomy    . Cardiac catheterization N/A 02/25/2015    Procedure: Left Heart Cath and Coronary Angiography;  Surgeon: [name], MD;  Location: [facility] INVASIVE CV LAB;  Service: Cardiovascular;  Laterality: N/A;  . Cardiac catheterization N/A 02/25/2015    Procedure: Coronary Stent Intervention;  Surgeon: [name], MD;  Location: [facility] INVASIVE CV LAB;  Service: Cardiovascular;  Laterality: N/A;    Social History   Social History  . Marital Status: Significant Other    Spouse Name: N/A  . Number of Children: N/A  . Years of Education: N/A   Occupational History  . Not on file.   Social History Main Topics  . Smoking status: Never Smoker   . Smokeless tobacco: Never Used  . Alcohol Use: 0.0 oz/week    0 Standard drinks or equivalent per week     Comment: socially  . Drug Use: No  . Sexual Activity: Not on  file   Other Topics Concern  . Not on file   Social History Narrative     Filed Vitals:   01/06/16 0824  BP: 116/66  Pulse: 68  Height: 5\' 11"  (1.803 m)  Weight: 244 lb (110.678 kg)  SpO2: 97%    PHYSICAL EXAM General: NAD HEENT: Normal. Neck: No JVD, no thyromegaly. Lungs: Clear to auscultation bilaterally with normal respiratory effort. CV: Nondisplaced PMI.  Regular rate and rhythm, normal S1/S2, no S3/S4, no murmur. No pretibial or periankle edema.  No carotid bruit.   Abdomen: Soft, nontender, no distention.  Neurologic: Alert and oriented.  Psych: Normal affect. Skin: Normal. Musculoskeletal: No gross  deformities.    ECG: Most recent ECG reviewed.      ASSESSMENT AND PLAN: 1. CAD with h/o NSTEMI and RCA PCI: Stable ischemic heart disease. Continue ASA, Brilinta (will reduce to 60 mg bid on 03/03/16), ramipril, and Lipitor. No longer taking metoprolol because his prescription ran out. I will restart at 12.5 mg bid.  2. Essential HTN: Controlled on ramipril. No changes.  3. Hyperlipidemia: Lipids 01/02/16 showed total cholesterol 135, trig glycerides 122, HDL 61, LDL 50. Continue Lipitor 40 mg daily.  4. Extensive dietary and exercise counseling given (5 minutes). He has a treadmill and exercise bike which he hasn't used. I encouraged him to do so.  Dispo: fu 1 year.  Time spent: 40 minutes, of which greater than 50% was spent reviewing symptoms, relevant blood tests and studies, and discussing management plan with the patient.   Prentice DockerSuresh Miia Blanks, M.D., F.A.C.C.

## 2016-01-06 NOTE — Patient Instructions (Addendum)
Your physician wants you to follow-up in: 1 year You will receive a reminder letter in the mail two months in advance. If you don't receive a letter, please call our office to schedule the follow-up appointment.  Starting August 1, you may reduce your Brilinta to 60 mg TWICE A DAY. PLEASE CALL [facility] TO NOTIFY THEM THAT DRUG WILL BE DECREASED IN AUGUST          Thank you for choosing [facility] !

## 2016-01-29 ENCOUNTER — Telehealth: Payer: Self-pay | Admitting: Cardiovascular Disease

## 2016-01-29 NOTE — Telephone Encounter (Signed)
Patient states new dose of Brilinta needs to be called to Brilinta. Please contact [employer].  tg

## 2016-01-30 NOTE — Telephone Encounter (Signed)
I spoke with company,they simply need new rx,faxed to [phone]-done

## 2016-02-12 ENCOUNTER — Telehealth: Payer: Self-pay | Admitting: Cardiovascular Disease

## 2016-02-12 NOTE — Telephone Encounter (Signed)
Pt has a question about his Brilinta. He has done all of the paper work to get the 60mg  and he's now he's doing 90 and they're needing a new Rx. Pt stated he's almost out of his medication

## 2016-02-12 NOTE — Telephone Encounter (Signed)
Called to check the status of Brilinta 60 mg. Instructed that more information was needed. Informed by Rosezella FloridaLisa, M. That information was faxed by Cathey, C. On yesterday.

## 2016-02-18 ENCOUNTER — Telehealth: Payer: Self-pay | Admitting: Cardiovascular Disease

## 2016-02-18 NOTE — Telephone Encounter (Signed)
Patient was speaking with Kisah about dosage of Brilinta being changed and sending it to [facility]. Did she leave any info on this?

## 2016-02-19 NOTE — Telephone Encounter (Signed)
Spoke to a rep with [employer] and they needed the MD's DEA #  On the script, so we wrote the information in and faxed it straight to the company. The lady I spoke with stated that the medication will be sent out priority mail tomorrow. Dennis Morrow has called the pt to let him know his medication will be mailed to him tomorrow in priority mail. We apologized to the pt for the delay in the process and he was understanding of the delay as he was not without any medication.

## 2016-03-09 ENCOUNTER — Other Ambulatory Visit: Payer: Self-pay

## 2016-03-09 MED ORDER — NITROGLYCERIN 0.4 MG SL SUBL: 0.4000 mg | SUBLINGUAL_TABLET | SUBLINGUAL | 3 refills | Status: DC | PRN

## 2016-04-14 ENCOUNTER — Telehealth: Payer: Self-pay | Admitting: Cardiovascular Disease

## 2016-04-14 ENCOUNTER — Other Ambulatory Visit: Payer: Self-pay

## 2016-04-14 MED ORDER — RAMIPRIL 5 MG PO CAPS: 5.0000 mg | ORAL_CAPSULE | Freq: Every day | ORAL | 11 refills | Status: DC

## 2016-04-14 NOTE — Telephone Encounter (Signed)
Refill on Ramipril sent to [facility]. / tg

## 2016-04-29 ENCOUNTER — Telehealth: Payer: Self-pay | Admitting: Cardiovascular Disease

## 2016-04-29 NOTE — Telephone Encounter (Signed)
Patient had 3 bottles of Brilinta 60mg  was delivered to our office today.  Qty: 60 in each bottle Sig: 1 Tablet by mouth Twice Daily  I left a message on patient's home and mobile phone letting him know his Rx is ready [place].  I have put the medication in the sample cabinet at the front desk.

## 2016-07-28 ENCOUNTER — Other Ambulatory Visit: Payer: Self-pay

## 2016-07-28 ENCOUNTER — Telehealth: Payer: Self-pay | Admitting: Cardiovascular Disease

## 2016-07-28 MED ORDER — ATORVASTATIN CALCIUM 40 MG PO TABS: 40.0000 mg | ORAL_TABLET | Freq: Every day | ORAL | 3 refills | Status: DC

## 2016-07-28 NOTE — Telephone Encounter (Signed)
Dennis Morrow reid Coumadin nurse states pt can take plain Mucinex for congestion,ptwill also try steam shower

## 2016-07-28 NOTE — Telephone Encounter (Signed)
Refilled artorvastatin 

## 2016-07-28 NOTE — Telephone Encounter (Signed)
Patient states that he has head cold and was advised by Pharmacist to ask Cardiologist what to take due to the blood thinner he is on. / tg

## 2016-07-31 ENCOUNTER — Telehealth: Payer: Self-pay | Admitting: *Deleted

## 2016-07-31 NOTE — Telephone Encounter (Signed)
Pt notified that Brilinta 60 mg is in office for pick up.

## 2017-01-05 ENCOUNTER — Encounter: Payer: Self-pay | Admitting: Cardiovascular Disease

## 2017-01-05 ENCOUNTER — Ambulatory Visit (INDEPENDENT_AMBULATORY_CARE_PROVIDER_SITE_OTHER): Payer: BLUE CROSS/BLUE SHIELD | Admitting: Cardiovascular Disease

## 2017-01-05 VITALS — BP 128/68 | HR 61 | Ht 71.0 in | Wt 251.0 lb

## 2017-01-05 DIAGNOSIS — Z713 Dietary counseling and surveillance: Secondary | ICD-10-CM

## 2017-01-05 DIAGNOSIS — E782 Mixed hyperlipidemia: Secondary | ICD-10-CM

## 2017-01-05 DIAGNOSIS — Z7182 Exercise counseling: Secondary | ICD-10-CM

## 2017-01-05 DIAGNOSIS — I25118 Atherosclerotic heart disease of native coronary artery with other forms of angina pectoris: Secondary | ICD-10-CM

## 2017-01-05 DIAGNOSIS — Z955 Presence of coronary angioplasty implant and graft: Secondary | ICD-10-CM

## 2017-01-05 DIAGNOSIS — I10 Essential (primary) hypertension: Secondary | ICD-10-CM

## 2017-01-05 DIAGNOSIS — J302 Other seasonal allergic rhinitis: Secondary | ICD-10-CM | POA: Diagnosis not present

## 2017-01-05 NOTE — Patient Instructions (Signed)
Your physician wants you to follow-up in: 1 year Dr Reggy EyeKoneswaran You will receive a reminder letter in the mail two months in advance. If you don't receive a letter, please call our office to schedule the follow-up appointment.    Your physician recommends that you continue on your current medications as directed. Please refer to the Current Medication list given to you today.    If you need a refill on your cardiac medications before your next appointment, please call your pharmacy.     Get Fasting lipids lab work     Thank you for [facility] !

## 2017-01-05 NOTE — Progress Notes (Signed)
SUBJECTIVE: The patient returns for routine follow-up for coronary artery disease. He was hospitalized from 7/23-7/26/16 with a NSTEMI.LHC 02/25/2015 showed 50% mid LAD, 100% distal RCA after takeoff of the PDA treated with DES (2.520 mm Synergy DES stent from distal RCA to PLA2, 2.2524 mm Synergy DES stent to PLA1) with transient dissection in the more distal PLA2 treated with PTCA.  Echocardiogram on 02/25/15 demonstrated normal left ventricular systolic and diastolic function, EF 50-55% , with normal regional wall motion.  ECG performed in the office today which I ordered and personally interpreted demonstrates normal sinus rhythm with no ischemic ST segment or T-wave abnormalities, nor any arrhythmias.   By our scales he has put on 7 pounds. At home he was up to 253 pounds over winter and is now down to 247 pounds. He golfs at least once per week and drinks 8 beers wall golfing and then additional beers afterwards. He also drinks beer when he is at the swimming pool. He does eat a hamburger with french fries for lunch somewhat regularly. He has not had use nitroglycerin since his last visit with me. He does take times for GERD. 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 He push mows his own lawn.  He had several questions again regarding his medications. He also had questions about seasonal allergies.   Review of Systems: As per "subjective", otherwise negative.  No Known Allergies  Current Outpatient Prescriptions  Medication Sig Dispense Refill  . aspirin EC 81 MG EC tablet Take 1 tablet (81 mg total) by mouth daily.    Marland Kitchen. atorvastatin (LIPITOR) 40 MG tablet Take 1 tablet (40 mg total) by mouth daily. 90 tablet 3  . calcium carbonate (TUMS - DOSED IN MG ELEMENTAL CALCIUM) 500 MG chewable tablet Chew 2 tablets by mouth as needed for indigestion or heartburn.    . metoprolol tartrate (LOPRESSOR) 25 MG tablet Take 0.5 tablets (12.5 mg total) by mouth  2 (two) times daily. 90 tablet 3  . Multiple Vitamin (MULTI VITAMIN MENS PO) Take 1 tablet by mouth daily.    . nitroGLYCERIN (NITROSTAT) 0.4 MG SL tablet Place 1 tablet (0.4 mg total) under the tongue every 5 (five) minutes x 3 doses as needed for chest pain. 25 tablet 3  . ramipril (ALTACE) 5 MG capsule Take 1 capsule (5 mg total) by mouth daily. 30 capsule 11  . ticagrelor (BRILINTA) 60 MG TABS tablet Take 60 mg by mouth 2 (two) times daily.     No current facility-administered medications for this visit.     Past Medical History:  Diagnosis Date  . Bradycardia   . CAD (coronary artery disease)    cath 02/25/2015 50% mid LAD, 100% distal RCA after takeoff of the PDA treated with DES (2.520 mm Synergy DES stent from distal RCA to PLA2, 2.2524 mm Synergy DES stent to PLA1) with transient dissection in the more distal PLA2 treated with PTCA.  Marland Kitchen. Hypercholesterolemia   . Hypertension     Past Surgical History:  Procedure Laterality Date  . CARDIAC CATHETERIZATION N/A 02/25/2015   Procedure: Left Heart Cath and Coronary Angiography;  Surgeon: Lennette Biharihomas A Kelly, MD;  [facility];  Service: Cardiovascular;  Laterality: N/A;  . CARDIAC CATHETERIZATION N/A 02/25/2015   Procedure: Coronary Stent Intervention;  Surgeon: Lennette Biharihomas A Kelly, MD;  Location: [facility] INVASIVE CV LAB;  Service: Cardiovascular;  Laterality: N/A;  . TONSILLECTOMY  Social History   Social History  . Marital status: Significant Other    Spouse name: N/A  . Number of children: N/A  . Years of education: N/A   Occupational History  . Not on file.   Social History Main Topics  . Smoking status: Never Smoker  . Smokeless tobacco: Never Used  . Alcohol use 0.0 oz/week     Comment: socially  . Drug use: No  . Sexual activity: Not on file   Other Topics Concern  . Not on file   Social History Narrative  . No narrative on file     Vitals:   01/05/17 1007  BP: 128/68  Pulse: 61  SpO2: 97%    Weight: 251 lb (113.9 kg)  Height: 5\' 11"  (1.803 m)    Wt Readings from Last 3 Encounters:  01/05/17 251 lb (113.9 kg)  01/06/16 244 lb (110.7 kg)  07/15/15 248 lb 3.2 oz (112.6 kg)     PHYSICAL EXAM General: NAD HEENT: Normal. Neck: No JVD, no thyromegaly. Lungs: Clear to auscultation bilaterally with normal respiratory effort. CV: Nondisplaced PMI.  Regular rate and rhythm, normal S1/S2, no S3/S4, no murmur. No pretibial or periankle edema.  No carotid bruit.   Abdomen: Firm, obese.  Neurologic: Alert and oriented.  Psych: Normal affect. Skin: Normal. Musculoskeletal: No gross deformities.    ECG: Most recent ECG reviewed.   Labs: Lab Results  Component Value Date/Time   K 4.1 03/05/2015 09:27 AM   BUN 19 03/05/2015 09:27 AM   CREATININE 0.95 03/05/2015 09:27 AM   ALT 36 02/24/2015 01:06 AM   TSH 3.590 02/24/2015 01:06 AM   HGB 13.7 02/26/2015 03:32 AM     Lipids: Lab Results  Component Value Date/Time   LDLCALC 50 01/02/2016 07:33 AM   CHOL 135 01/02/2016 07:33 AM   TRIG 122 01/02/2016 07:33 AM   HDL 61 01/02/2016 07:33 AM       ASSESSMENT AND PLAN:  1. CAD with h/o NSTEMI and RCA PCI: Symptomatically stable. Continue aspirin, Brilinta, metoprolol, ramipril, and Lipitor.  2. Essential HTN: Controlled. No changes to therapy.  3. Hyperlipidemia: I will obtain a lipid panel. Continue Lipitor 40 mg.  4. Morbid obesity: We spoke at length about exercise and dietary modification.  5. Extensive exercise and dietary counseling provided.  6. Seasonal allergies: I told him it would be permissible to use nasal steroids. I also recommended he see an allergist.    Disposition: Follow up 1 year.  Time spent: 40 minutes, of which greater than 50% was spent reviewing symptoms, relevant blood tests and studies, and discussing management plan with the patient.   Prentice Docker, M.D., F.A.C.C.

## 2017-01-12 ENCOUNTER — Other Ambulatory Visit: Payer: Self-pay | Admitting: Cardiovascular Disease

## 2017-01-13 DIAGNOSIS — E782 Mixed hyperlipidemia: Secondary | ICD-10-CM | POA: Diagnosis not present

## 2017-01-13 LAB — LIPID PANEL
CHOLESTEROL: 139 mg/dL (ref <200)
HDL: 51 mg/dL (ref >40)
LDL Cholesterol: 67 mg/dL (ref <100)
TRIGLYCERIDES: 107 mg/dL (ref <150)
Total CHOL/HDL Ratio: 2.7 Ratio (ref <5.0)
VLDL: 21 mg/dL (ref <30)

## 2017-01-14 ENCOUNTER — Telehealth: Payer: Self-pay

## 2017-01-14 NOTE — Telephone Encounter (Signed)
Called pt, no answer, left message for pt to return call.

## 2017-01-14 NOTE — Telephone Encounter (Signed)
-----   Message from Laqueta LindenSuresh A Koneswaran, MD sent at 01/14/2017 11:35 AM EDT ----- Normal.

## 2017-02-16 ENCOUNTER — Telehealth: Payer: Self-pay | Admitting: Cardiovascular Disease

## 2017-02-16 NOTE — Telephone Encounter (Signed)
Pt is having a hard time getting his Brilinta refilled --can be reached @ [phone]

## 2017-02-16 NOTE — Telephone Encounter (Signed)
L Pinnix ,LPN ,faxed pt assistance form today for Brilinta  Through [employer]

## 2017-03-18 ENCOUNTER — Encounter (INDEPENDENT_AMBULATORY_CARE_PROVIDER_SITE_OTHER): Payer: Self-pay

## 2017-03-19 ENCOUNTER — Other Ambulatory Visit: Payer: Self-pay | Admitting: Cardiovascular Disease

## 2017-04-07 ENCOUNTER — Other Ambulatory Visit: Payer: Self-pay | Admitting: *Deleted

## 2017-04-07 MED ORDER — TICAGRELOR 60 MG PO TABS: 60.0000 mg | ORAL_TABLET | Freq: Two times a day (BID) | ORAL | 11 refills | Status: DC

## 2017-06-21 ENCOUNTER — Telehealth: Payer: Self-pay | Admitting: Cardiology

## 2017-06-21 NOTE — Telephone Encounter (Signed)
Dr Purvis SheffieldKoneswaran suggested pt see dermatology. I left voice mail on pt's private line

## 2017-06-21 NOTE — Telephone Encounter (Signed)
Pt called stating he's having itching on ankles x 2 wks, he has no rash and is wondering if it may be from his medication. Please give him a call @ [phone]

## 2017-06-21 NOTE — Telephone Encounter (Signed)
Patient has itching under right nipple and under both arms too.has been bothered for 2 weeks, has tried benadryl, hydrocortisone cream,and is miserable.No  Rash noted, only intense itching

## 2017-06-21 NOTE — Telephone Encounter (Signed)
Dr K patient, please forward to him  J Branch MD 

## 2017-06-21 NOTE — Telephone Encounter (Signed)
I did not add any new meds at his last visit.

## 2017-08-13 ENCOUNTER — Other Ambulatory Visit: Payer: Self-pay | Admitting: Cardiovascular Disease

## 2017-11-08 ENCOUNTER — Telehealth: Payer: Self-pay | Admitting: Cardiovascular Disease

## 2017-11-08 NOTE — Telephone Encounter (Signed)
Patient is requesting samples of Brilinta and wanting to discuss patient assistance program. / tg

## 2017-11-09 NOTE — Telephone Encounter (Signed)
Returned pt call. No answer, left message for pt to return call.  

## 2017-11-09 NOTE — Telephone Encounter (Signed)
Spoke with pt. He states he will bring his income information  to apply for pt assistance. Will give pt. 2 weeks of samples.

## 2017-11-24 ENCOUNTER — Telehealth: Payer: Self-pay | Admitting: Cardiovascular Disease

## 2017-11-24 NOTE — Telephone Encounter (Signed)
Called pt. No word on his assistance as of yet. Given samples.

## 2017-11-24 NOTE — Telephone Encounter (Signed)
Pt is checking on his assistance for his Brilinta and he took his last pill from the samples

## 2017-12-10 NOTE — Telephone Encounter (Signed)
Samples given Brilinta 60 mg lot [id number]  Exp 03/2020 , 5 boxes  Ii will speak with K.[employer] LPN, who is assisting patient with process

## 2017-12-10 NOTE — Telephone Encounter (Signed)
Pt is checking on his assistance for his Brilinta and he took his last pill from the samples  Please call patient [phone]

## 2018-01-26 ENCOUNTER — Encounter: Payer: Self-pay | Admitting: Cardiovascular Disease

## 2018-01-26 ENCOUNTER — Ambulatory Visit (INDEPENDENT_AMBULATORY_CARE_PROVIDER_SITE_OTHER): Payer: BLUE CROSS/BLUE SHIELD | Admitting: Cardiovascular Disease

## 2018-01-26 ENCOUNTER — Other Ambulatory Visit (HOSPITAL_COMMUNITY)
Admission: RE | Admit: 2018-01-26 | Discharge: 2018-01-26 | Disposition: A | Discharge status: Home / Self Care | Payer: BLUE CROSS/BLUE SHIELD | Source: Ambulatory Visit | Attending: Cardiovascular Disease | Admitting: Cardiovascular Disease

## 2018-01-26 VITALS — BP 126/84 | HR 67 | Ht 71.0 in | Wt 252.0 lb

## 2018-01-26 DIAGNOSIS — I25118 Atherosclerotic heart disease of native coronary artery with other forms of angina pectoris: Secondary | ICD-10-CM

## 2018-01-26 DIAGNOSIS — E785 Hyperlipidemia, unspecified: Secondary | ICD-10-CM | POA: Diagnosis not present

## 2018-01-26 DIAGNOSIS — Z7182 Exercise counseling: Secondary | ICD-10-CM | POA: Diagnosis not present

## 2018-01-26 DIAGNOSIS — Z955 Presence of coronary angioplasty implant and graft: Secondary | ICD-10-CM | POA: Diagnosis not present

## 2018-01-26 DIAGNOSIS — I10 Essential (primary) hypertension: Secondary | ICD-10-CM

## 2018-01-26 LAB — LIPID PANEL
CHOL/HDL RATIO: 2.9 ratio
Cholesterol: 137 mg/dL (ref 0–200)
HDL: 48 mg/dL (ref >40)
LDL Cholesterol: 70 mg/dL (ref 0–99)
Triglycerides: 95 mg/dL (ref <150)
VLDL: 19 mg/dL (ref 0–40)

## 2018-01-26 MED ORDER — RAMIPRIL 5 MG PO CAPS: 5.0000 mg | ORAL_CAPSULE | Freq: Every day | ORAL | 3 refills | Status: DC

## 2018-01-26 MED ORDER — ATORVASTATIN CALCIUM 40 MG PO TABS: ORAL_TABLET | ORAL | 3 refills | Status: DC

## 2018-01-26 MED ORDER — METOPROLOL TARTRATE 25 MG PO TABS: 12.5000 mg | ORAL_TABLET | Freq: Two times a day (BID) | ORAL | 3 refills | Status: DC

## 2018-01-26 NOTE — Progress Notes (Signed)
SUBJECTIVE: The patient presents for routine follow-up of coronary artery disease. He was hospitalized from7/23-7/26/16 with a NSTEMI.LHC 02/25/2015 showed 50% mid LAD, 100% distal RCA after takeoff of the PDA treated with DES (2.520 mm Synergy DES stent from distal RCA to PLA2, 2.2524 mm Synergy DES stent to PLA1) with transient dissection in the more distal PLA2 treated with PTCA.  Echocardiogram on 02/25/15 demonstrated normal left ventricular systolic and diastolic function, EF 50-55% , with normal regional wall motion.  The ECG which I ordered and personally reviewed demonstrates sinus bradycardia with no significant ischemic abnormalities.  He told me he weighed 256 pounds at the beginning of the year and got down to 240 pounds but is up to 252 pounds on our scales.  He said he hangs around the swimming pool and drinks beer quite a bit in the summer.  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  He has a treadmill in his bedroom but has not been disciplined enough to make it a part of his daily routine.  The patient denies any symptoms of chest pain, palpitations, shortness of breath, lightheadedness, dizziness, leg swelling, orthopnea, PND, and syncope.  He has not had use nitroglycerin.  He has not seen his PCP in at least a year.   Review of Systems: As per "subjective", otherwise negative.  No Known Allergies  Current Outpatient Medications  Medication Sig Dispense Refill  . aspirin EC 81 MG EC tablet Take 1 tablet (81 mg total) by mouth daily.    Marland Kitchen atorvastatin (LIPITOR) 40 MG tablet TAKE ONE (1) TABLET BY MOUTH EVERY DAY 90 tablet 3  . calcium carbonate (TUMS - DOSED IN MG ELEMENTAL CALCIUM) 500 MG chewable tablet Chew 2 tablets by mouth as needed for indigestion or heartburn.    . metoprolol tartrate (LOPRESSOR) 25 MG tablet TAKE ONE-HALF TABLET BY MOUTH TWICE DAILY 90 tablet 3  . Multiple Vitamin (MULTI VITAMIN MENS PO) Take 1 tablet  by mouth daily.    . nitroGLYCERIN (NITROSTAT) 0.4 MG SL tablet Place 1 tablet (0.4 mg total) under the tongue every 5 (five) minutes x 3 doses as needed for chest pain. 25 tablet 3  . ramipril (ALTACE) 5 MG capsule TAKE ONE CAPSULE ONCE DAILY 90 capsule 3  . ticagrelor (BRILINTA) 60 MG TABS tablet Take 1 tablet (60 mg total) by mouth 2 (two) times daily. 60 tablet 11   No current facility-administered medications for this visit.     Past Medical History:  Diagnosis Date  . Bradycardia   . CAD (coronary artery disease)    cath 02/25/2015 50% mid LAD, 100% distal RCA after takeoff of the PDA treated with DES (2.520 mm Synergy DES stent from distal RCA to PLA2, 2.2524 mm Synergy DES stent to PLA1) with transient dissection in the more distal PLA2 treated with PTCA.  Marland Kitchen Hypercholesterolemia   . Hypertension     Past Surgical History:  Procedure Laterality Date  . CARDIAC CATHETERIZATION N/A 02/25/2015   Procedure: Left Heart Cath and Coronary Angiography;  Surgeon: [name], MD;  Location: [name] INVASIVE CV LAB;  Service: Cardiovascular;  Laterality: N/A;  . CARDIAC CATHETERIZATION N/A 02/25/2015   Procedure: Coronary Stent Intervention;  Surgeon: [name], MD;  Location: [facility] INVASIVE CV LAB;  Service: Cardiovascular;  Laterality: N/A;  . TONSILLECTOMY      Social History   Socioeconomic History  . Marital status: Significant Other    Spouse name:  Not on file  . Number of children: Not on file  . Years of education: Not on file  . Highest education level: Not on file  Occupational History  . Not on file  Social Needs  . Financial resource strain: Not on file  . Food insecurity:    Worry: Not on file    Inability: Not on file  . Transportation needs:    Medical: Not on file    Non-medical: Not on file  Tobacco Use  . Smoking status: Never Smoker  . Smokeless tobacco: Never Used  Substance and Sexual Activity  . Alcohol use: Yes    Alcohol/week: 0.0 oz    Comment:  socially  . Drug use: No  . Sexual activity: Not on file  Lifestyle  . Physical activity:    Days per week: Not on file    Minutes per session: Not on file  . Stress: Not on file  Relationships  . Social connections:    Talks on phone: Not on file    Gets together: Not on file    Attends religious service: Not on file    Active member of club or organization: Not on file    Attends meetings of clubs or organizations: Not on file    Relationship status: Not on file  . Intimate partner violence:    Fear of current or ex partner: Not on file    Emotionally abused: Not on file    Physically abused: Not on file    Forced sexual activity: Not on file  Other Topics Concern  . Not on file  Social History Narrative  . Not on file     Vitals:   01/26/18 0906  BP: 126/84  Pulse: 67  SpO2: 96%  Weight: 252 lb (114.3 kg)  Height: 5\' 11"  (1.803 m)    Wt Readings from Last 3 Encounters:  01/26/18 252 lb (114.3 kg)  01/05/17 251 lb (113.9 kg)  01/06/16 244 lb (110.7 kg)     PHYSICAL EXAM General: NAD HEENT: Normal. Neck: No JVD, no thyromegaly. Lungs: Clear to auscultation bilaterally with normal respiratory effort. CV: Regular rate and rhythm, normal S1/S2, no S3/S4, no murmur. No pretibial or periankle edema.  No carotid bruit.   Abdomen: Arm, obese.  Neurologic: Alert and oriented.  Psych: Normal affect. Skin: Normal. Musculoskeletal: No gross deformities.    ECG: Reviewed above under Subjective   Labs: Lab Results  Component Value Date/Time   K 4.1 03/05/2015 09:27 AM   BUN 19 03/05/2015 09:27 AM   CREATININE 0.95 03/05/2015 09:27 AM   ALT 36 02/24/2015 01:06 AM   TSH 3.590 02/24/2015 01:06 AM   HGB 13.7 02/26/2015 03:32 AM     Lipids: Lab Results  Component Value Date/Time   LDLCALC 67 01/13/2017 08:01 AM   CHOL 139 01/13/2017 08:01 AM   TRIG 107 01/13/2017 08:01 AM   HDL 51 01/13/2017 08:01 AM       ASSESSMENT AND PLAN: 1.  Coronary disease  with history of non-STEMI and PCI of the right coronary artery: Symptomatically stable.  Continue aspirin, Lipitor, metoprolol, and ramipril.  I will discontinue Brilinta.   2.  Hypertension: Blood pressure is normal.  No changes to therapy.  3.  Hyperlipidemia: I will check lipids.  Continue Lipitor.  4.  Morbid obesity: I provided him with dietary and exercise counseling.  We talked about the importance of weight loss getting into a daily exercise regimen such as walking on  the treadmill for 30 minutes every morning.  He admits to drinking a lot of beer in the summertime and not getting enough exercise.    Disposition: Follow up 1 year   Prentice Docker, M.D., F.A.C.C.

## 2018-01-26 NOTE — Patient Instructions (Signed)
Medication Instructions:  STOP BRILINTA  Labwork: ASAP  Testing/Procedures: NONE  Follow-Up: Your physician wants you to follow-up in: 1 YEAR.  You will receive a reminder letter in the mail two months in advance. If you don't receive a letter, please call our office to schedule the follow-up appointment.   Any Other Special Instructions Will Be Listed Below (If Applicable).     If you need a refill on your cardiac medications before your next appointment, please call your pharmacy.

## 2018-01-27 ENCOUNTER — Telehealth: Payer: Self-pay

## 2018-01-27 NOTE — Telephone Encounter (Signed)
Called pt. Advised him of lab results. He voiced understanding. Copy to pcp.

## 2018-01-27 NOTE — Telephone Encounter (Signed)
-----   Message from Laqueta LindenSuresh A Koneswaran, MD sent at 01/26/2018 11:59 AM EDT ----- LDL 70. Continue Lipitor at present dose.

## 2018-02-15 ENCOUNTER — Telehealth: Payer: Self-pay | Admitting: Cardiovascular Disease

## 2018-02-15 NOTE — Telephone Encounter (Signed)
Pt  notified. Copy to pcp.

## 2018-02-15 NOTE — Telephone Encounter (Signed)
Wanting to know lab results  °

## 2018-06-01 DIAGNOSIS — Z0000 Encounter for general adult medical examination without abnormal findings: Secondary | ICD-10-CM | POA: Diagnosis not present

## 2018-06-01 DIAGNOSIS — R7309 Other abnormal glucose: Secondary | ICD-10-CM | POA: Diagnosis not present

## 2018-06-01 DIAGNOSIS — I10 Essential (primary) hypertension: Secondary | ICD-10-CM | POA: Diagnosis not present

## 2018-06-01 DIAGNOSIS — Z0001 Encounter for general adult medical examination with abnormal findings: Secondary | ICD-10-CM | POA: Diagnosis not present

## 2018-06-01 DIAGNOSIS — E119 Type 2 diabetes mellitus without complications: Secondary | ICD-10-CM | POA: Diagnosis not present

## 2018-06-01 DIAGNOSIS — E6609 Other obesity due to excess calories: Secondary | ICD-10-CM | POA: Diagnosis not present

## 2018-06-01 DIAGNOSIS — Z1389 Encounter for screening for other disorder: Secondary | ICD-10-CM | POA: Diagnosis not present

## 2018-06-01 DIAGNOSIS — Z6835 Body mass index (BMI) 35.0-35.9, adult: Secondary | ICD-10-CM | POA: Diagnosis not present

## 2018-06-27 ENCOUNTER — Telehealth: Payer: Self-pay | Admitting: Cardiovascular Disease

## 2018-06-27 NOTE — Telephone Encounter (Signed)
Would like to know since he's stopped the Brilinta can he go back to using ibuprofen.

## 2018-06-27 NOTE — Telephone Encounter (Signed)
Advised pt per Dr. Eden EmmsNishan (which is sitting beside me) states that he may take ibuprofen since stopping Brilinta.

## 2019-01-10 DIAGNOSIS — Z1389 Encounter for screening for other disorder: Secondary | ICD-10-CM | POA: Diagnosis not present

## 2019-01-10 DIAGNOSIS — J209 Acute bronchitis, unspecified: Secondary | ICD-10-CM | POA: Diagnosis not present

## 2019-01-10 DIAGNOSIS — Z6834 Body mass index (BMI) 34.0-34.9, adult: Secondary | ICD-10-CM | POA: Diagnosis not present

## 2019-01-10 DIAGNOSIS — Z23 Encounter for immunization: Secondary | ICD-10-CM | POA: Diagnosis not present

## 2019-01-10 DIAGNOSIS — E6609 Other obesity due to excess calories: Secondary | ICD-10-CM | POA: Diagnosis not present

## 2019-01-26 ENCOUNTER — Other Ambulatory Visit: Payer: BLUE CROSS/BLUE SHIELD

## 2019-01-26 ENCOUNTER — Other Ambulatory Visit: Payer: Self-pay

## 2019-01-26 DIAGNOSIS — Z20822 Contact with and (suspected) exposure to covid-19: Secondary | ICD-10-CM

## 2019-01-26 DIAGNOSIS — R6889 Other general symptoms and signs: Secondary | ICD-10-CM | POA: Diagnosis not present

## 2019-01-30 LAB — NOVEL CORONAVIRUS, NAA: SARS-CoV-2, NAA: NOT DETECTED

## 2019-02-07 ENCOUNTER — Other Ambulatory Visit: Payer: Self-pay | Admitting: Cardiovascular Disease

## 2019-05-03 ENCOUNTER — Other Ambulatory Visit: Payer: Self-pay | Admitting: Cardiovascular Disease

## 2019-05-15 ENCOUNTER — Other Ambulatory Visit: Payer: Self-pay | Admitting: Cardiovascular Disease

## 2019-08-29 ENCOUNTER — Ambulatory Visit: Payer: 59 | Admitting: Cardiovascular Disease

## 2019-08-29 ENCOUNTER — Encounter: Payer: Self-pay | Admitting: Cardiovascular Disease

## 2019-08-29 VITALS — BP 132/76 | HR 61 | Temp 97.8°F | Ht 71.0 in | Wt 255.0 lb

## 2019-08-29 DIAGNOSIS — M79602 Pain in left arm: Secondary | ICD-10-CM

## 2019-08-29 DIAGNOSIS — I10 Essential (primary) hypertension: Secondary | ICD-10-CM

## 2019-08-29 DIAGNOSIS — I25118 Atherosclerotic heart disease of native coronary artery with other forms of angina pectoris: Secondary | ICD-10-CM

## 2019-08-29 DIAGNOSIS — E785 Hyperlipidemia, unspecified: Secondary | ICD-10-CM | POA: Diagnosis not present

## 2019-08-29 DIAGNOSIS — Z955 Presence of coronary angioplasty implant and graft: Secondary | ICD-10-CM

## 2019-08-29 DIAGNOSIS — Z7182 Exercise counseling: Secondary | ICD-10-CM

## 2019-08-29 DIAGNOSIS — M79601 Pain in right arm: Secondary | ICD-10-CM

## 2019-08-29 DIAGNOSIS — M779 Enthesopathy, unspecified: Secondary | ICD-10-CM

## 2019-08-29 MED ORDER — PREDNISONE 5 MG PO TABS: ORAL_TABLET | ORAL | 0 refills | Status: DC

## 2019-08-29 NOTE — Progress Notes (Signed)
SUBJECTIVE: The patient presents for routine follow-up of coronary artery disease. He was hospitalized from7/23-7/26/16 with a NSTEMI.LHC 02/25/2015 showed 50% mid LAD, 100% distal RCA after takeoff of the PDA treated with DES (2.520 mm Synergy DES stent from distal RCA to PLA2, 2.2524 mm Synergy DES stent to PLA1) with transient dissection in the more distal PLA2 treated with PTCA.  Echocardiogram on 02/25/15 demonstrated normal left ventricular systolic and diastolic function, EF 50-55% , with normal regional wall motion.  The patient denies any symptoms of chest pain, palpitations, shortness of breath, lightheadedness, dizziness, leg swelling, orthopnea, PND, and syncope.  He has never had to use nitroglycerin.  His primary complaints today relate to bilateral arm pain.  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  He saw his chiropractor.  He requests an oral steroid as he would like to avoid having to get an injection if possible.  We also talked about the importance of exercise and weight loss.  He has several pieces of exercise equipment and recently began using his exercise bicycle.    Review of Systems: As per "subjective", otherwise negative.  No Known Allergies  Current Outpatient Medications  Medication Sig Dispense Refill  . aspirin EC 81 MG EC tablet Take 1 tablet (81 mg total) by mouth daily.    Marland Kitchen atorvastatin (LIPITOR) 40 MG tablet TAKE ONE (1) TABLET BY MOUTH EVERY DAY 90 tablet 3  . metoprolol tartrate (LOPRESSOR) 25 MG tablet TAKE 0.5 TABLETS BY MOUTH (12.5 MG TOTAL) BY MOUTH 2 TIMES DAILY 90 tablet 3  . Multiple Vitamin (MULTI VITAMIN MENS PO) Take 1 tablet by mouth daily.    . nitroGLYCERIN (NITROSTAT) 0.4 MG SL tablet Place 1 tablet (0.4 mg total) under the tongue every 5 (five) minutes x 3 doses as needed for chest pain. 25 tablet 3  . ramipril (ALTACE) 5  MG capsule TAKE ONE CAPSULE BY MOUTH DAILY 90 capsule 3   No current facility-administered medications for this visit.    Past Medical History:  Diagnosis Date  . Bradycardia   . CAD (coronary artery disease)    cath 02/25/2015 50% mid LAD, 100% distal RCA after takeoff of the PDA treated with DES (2.520 mm Synergy DES stent from distal RCA to PLA2, 2.2524 mm Synergy DES stent to PLA1) with transient dissection in the more distal PLA2 treated with PTCA.  Marland Kitchen Hypercholesterolemia   . Hypertension     Past Surgical History:  Procedure Laterality Date  . CARDIAC CATHETERIZATION N/A 02/25/2015   Procedure: Left Heart Cath and Coronary Angiography;  Surgeon: [name], MD;  Location[facility] INVASIVE CV LAB;  Service: Cardiovascular;  Laterality: N/A;  . CARDIAC CATHETERIZATION N/A 02/25/2015   Procedure: Coronary Stent Intervention;  Surgeon: [name], MD;  Location: [facility] INVASIVE CV LAB;  Service: Cardiovascular;  Laterality: N/A;  . TONSILLECTOMY      Social History   Socioeconomic History  . Marital status: Significant Other    Spouse name: Not on file  . Number of children: Not on file  . Years of education: Not on file  . Highest education level: Not on file  Occupational History  . Not on file  Tobacco Use  . Smoking status: Never Smoker  . Smokeless tobacco: Never Used  Substance and Sexual Activity  . Alcohol use: Yes    Alcohol/week: 0.0 standard  drinks    Comment: socially  . Drug use: No  . Sexual activity: Not on file  Other Topics Concern  . Not on file  Social History Narrative  . Not on file   Social Determinants of Health   Financial Resource Strain:   . Difficulty of Paying Living Expenses: Not on file  Food Insecurity:   . Worried About Programme researcher, broadcasting/film/video in the Last Year: Not on file  . Ran Out of Food in the Last Year: Not on file  Transportation Needs:   . Lack of Transportation (Medical): Not on file  . Lack of Transportation (Non-Medical):  Not on file  Physical Activity:   . Days of Exercise per Week: Not on file  . Minutes of Exercise per Session: Not on file  Stress:   . Feeling of Stress : Not on file  Social Connections:   . Frequency of Communication with Friends and Family: Not on file  . Frequency of Social Gatherings with Friends and Family: Not on file  . Attends Religious Services: Not on file  . Active Member of Clubs or Organizations: Not on file  . Attends Banker Meetings: Not on file  . Marital Status: Not on file  Intimate Partner Violence:   . Fear of Current or Ex-Partner: Not on file  . Emotionally Abused: Not on file  . Physically Abused: Not on file  . Sexually Abused: Not on file     Vitals:   08/29/19 1556  BP: 132/76  Pulse: 61  Temp: 97.8 F (36.6 C)  SpO2: 98%  Weight: 255 lb (115.7 kg)  Height: 5\' 11"  (1.803 m)    Wt Readings from Last 3 Encounters:  08/29/19 255 lb (115.7 kg)  01/26/18 252 lb (114.3 kg)  01/05/17 251 lb (113.9 kg)     PHYSICAL EXAM General: NAD, obese HEENT: Normal. Neck: No JVD, no thyromegaly. Lungs: Clear to auscultation bilaterally with normal respiratory effort. CV: Regular rate and rhythm, normal S1/S2, no S3/S4, no murmur. No pretibial or periankle edema.  No carotid bruit.   Abdomen: Soft, obese.  Neurologic: Alert and oriented.  Psych: Normal affect. Skin: Normal. Musculoskeletal: No gross deformities.      Labs: Lab Results  Component Value Date/Time   K 4.1 03/05/2015 09:27 AM   BUN 19 03/05/2015 09:27 AM   CREATININE 0.95 03/05/2015 09:27 AM   ALT 36 02/24/2015 01:06 AM   TSH 3.590 02/24/2015 01:06 AM   HGB 13.7 02/26/2015 03:32 AM     Lipids: Lab Results  Component Value Date/Time   LDLCALC 70 01/26/2018 09:51 AM   CHOL 137 01/26/2018 09:51 AM   TRIG 95 01/26/2018 09:51 AM   HDL 48 01/26/2018 09:51 AM       ASSESSMENT AND PLAN: 1.  Coronary disease with history of non-STEMI and PCI of the right coronary  artery: Symptomatically stable.  Continue aspirin, Lipitor, metoprolol, and ramipril.   2.  Hypertension: Blood pressure is normal.  No changes to therapy.  3.  Hyperlipidemia: I will check lipids.  Continue Lipitor.  4.  Morbid obesity: We had a lengthy discussion about exercise and weight loss.  He has multiple pieces of exercise equipment and recent began using his exercise bike.  5.  Bilateral arm pain/tendinitis: He would like to avoid getting a steroid injection and requests an oral steroid.  I am not certain he will get symptom relief but I will try prednisone 5 mg daily for 1  week.  I also told him he should probably see an orthopedic specialist and perhaps physical therapy.    Disposition: Follow up 1 yr  Time spent: 40 minutes, of which greater than 50% was spent reviewing symptoms, relevant blood tests and studies, and discussing management plan with the patient.    Kate Sable, M.D., F.A.C.C.

## 2019-08-29 NOTE — Addendum Note (Signed)
Addended by: Marlyn Corporal A on: 08/29/2019 04:47 PM   Modules accepted: Orders

## 2019-08-29 NOTE — Patient Instructions (Signed)
Medication Instructions:  Take Prednisone 5 mg daily for 7 days and the STOP.   All other medications stay the same   *If you need a refill on your cardiac medications before your next appointment, please call your pharmacy*  Lab Work: Fasting Lipids  If you have labs (blood work) drawn today and your tests are completely normal, you will receive your results only by: Marland Kitchen MyChart Message (if you have MyChart) OR . A paper copy in the mail If you have any lab test that is abnormal or we need to change your treatment, we will call you to review the results.  Testing/Procedures: NONE  Follow-Up: At [facility], you and your health needs are our priority.  As part of our continuing mission to provide you with exceptional heart care, we have created designated Provider Care Teams.  These Care Teams include your primary Cardiologist (physician) and Advanced Practice Providers (APPs -  Physician Assistants and Nurse Practitioners) who all work together to provide you with the care you need, when you need it.  Your next appointment:   12 month(s)  The format for your next appointment:   In Person  Provider:   Randall An, PA-C or Jacolyn Reedy, PA-C  Other Instructions None      Thank you for choosing [facility] !

## 2019-09-01 ENCOUNTER — Other Ambulatory Visit: Payer: Self-pay | Admitting: Cardiovascular Disease

## 2019-09-02 LAB — LIPID PANEL W/O CHOL/HDL RATIO
Cholesterol, Total: 151 mg/dL (ref 100–199)
HDL: 59 mg/dL (ref >39)
LDL Chol Calc (NIH): 77 mg/dL (ref 0–99)
Triglycerides: 78 mg/dL (ref 0–149)
VLDL Cholesterol Cal: 15 mg/dL (ref 5–40)

## 2019-11-09 ENCOUNTER — Other Ambulatory Visit: Payer: Self-pay

## 2019-11-09 ENCOUNTER — Ambulatory Visit: Payer: 59 | Attending: Family Medicine

## 2019-11-09 DIAGNOSIS — Z23 Encounter for immunization: Secondary | ICD-10-CM

## 2019-11-09 NOTE — Progress Notes (Signed)
   Covid-19 Vaccination Clinic  Name:  Dennis Morrow    MRN: [id number] DOB: Dec 27, 1960  11/09/2019  [name] was observed post Covid-19 immunization for 15 minutes without incident. He was provided with Vaccine Information Sheet and instruction to access the V-Safe system.   [name] was instructed to call 911 with any severe reactions post vaccine: Marland Kitchen Difficulty breathing  . Swelling of face and throat  . A fast heartbeat  . A bad rash all over body  . Dizziness and weakness   Immunizations Administered    Name Date Dose VIS Date Route   Moderna COVID-19 Vaccine 11/09/2019  8:35 AM 0.5 mL 07/04/2019 Intramuscular   Manufacturer: Gala Murdoch   Lot: [id number]   NDC: 89381-017-51      Covid-19 Vaccination Clinic  Name:  Dennis Morrow    MRN: [id number] DOB: Feb 24, 1961  11/09/2019  [name] was observed post Covid-19 immunization for 15 minutes without incident. He was provided with Vaccine Information Sheet and instruction to access the V-Safe system.   [name] was instructed to call 911 with any severe reactions post vaccine: Marland Kitchen Difficulty breathing  . Swelling of face and throat  . A fast heartbeat  . A bad rash all over body  . Dizziness and weakness   Immunizations Administered    Name Date Dose VIS Date Route   Moderna COVID-19 Vaccine 11/09/2019  8:35 AM 0.5 mL 07/04/2019 Intramuscular   Manufacturer: Moderna   Lot: [id number]   NDC: 14431-540-08

## 2019-12-12 ENCOUNTER — Ambulatory Visit: Payer: 59 | Attending: Internal Medicine

## 2019-12-12 DIAGNOSIS — Z23 Encounter for immunization: Secondary | ICD-10-CM

## 2019-12-12 NOTE — Progress Notes (Signed)
   Covid-19 Vaccination Clinic  Name:  Dennis Morrow    MRN: [id number] DOB: [birth year]  12/12/2019  [name] was observed post Covid-19 immunization for 15 minutes without incident. He was provided with Vaccine Information Sheet and instruction to access the V-Safe system.   [name] was instructed to call 911 with any severe reactions post vaccine: Marland Kitchen Difficulty breathing  . Swelling of face and throat  . A fast heartbeat  . A bad rash all over body  . Dizziness and weakness   Immunizations Administered    Name Date Dose VIS Date Route   Moderna COVID-19 Vaccine 12/12/2019 10:49 AM 0.5 mL 07/2019 Intramuscular   Manufacturer: Moderna   Lot: [id number]   NDC: 91638-466-59

## 2020-02-23 ENCOUNTER — Telehealth: Payer: Self-pay | Admitting: Student

## 2020-02-23 ENCOUNTER — Encounter: Payer: Self-pay | Admitting: *Deleted

## 2020-02-23 MED ORDER — RAMIPRIL 5 MG PO CAPS: 5.0000 mg | ORAL_CAPSULE | Freq: Every day | ORAL | 3 refills | Status: DC

## 2020-02-23 MED ORDER — ATORVASTATIN CALCIUM 40 MG PO TABS: 40.0000 mg | ORAL_TABLET | Freq: Every day | ORAL | 3 refills | Status: DC

## 2020-02-23 NOTE — Telephone Encounter (Signed)
Refills sent

## 2020-02-23 NOTE — Telephone Encounter (Signed)
     Can provide refills. Please request a copy of the most recent labs from his PCP as the most recent BMET on file is from 2016.  Thanks,  [place]

## 2020-02-23 NOTE — Telephone Encounter (Signed)
[employer] denying refills.Please approve refills per Pt. Ramipril and atorvastatin (former [facility] Pt )    Thanks renee

## 2020-02-23 NOTE — Addendum Note (Signed)
Addended by: Kerney Elbe on: 02/23/2020 06:35 PM   Modules accepted: Orders

## 2020-07-04 ENCOUNTER — Other Ambulatory Visit: Payer: Self-pay

## 2020-07-04 MED ORDER — METOPROLOL TARTRATE 25 MG PO TABS: ORAL_TABLET | ORAL | 3 refills | Status: DC

## 2020-07-04 NOTE — Telephone Encounter (Signed)
Refilled lopressor

## 2020-10-31 NOTE — Progress Notes (Signed)
Cardiologist:  Sharia Reeve  SUBJECTIVE:  60 y.o. new to me. Previously seen by [facility].  History of CAD with stenting of the distal RCA/PLB 02/25/15 and resitual 50% mid LAD. EF 50-55% He has been vaccinated x 2 for COVID He is on statin for lipids. CRF;s HLD, HTN He had typical angina prior to stents. He is overweight and likes his food. 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 I discussed f/u stress testing since its been 6 years , his ECG today has T wave inversions in 3,F and he had residual LAD disease he has chronic shoulder pain from work and use to play football at [employer]   Review of Systems: As per "subjective", otherwise negative.  No Known Allergies  Current Outpatient Medications  Medication Sig Dispense Refill  . albuterol (VENTOLIN HFA) 108 (90 Base) MCG/ACT inhaler Inhale 2 puffs into the lungs.    Marland Kitchen aspirin EC 81 MG EC tablet Take 1 tablet (81 mg total) by mouth daily.    Marland Kitchen atorvastatin (LIPITOR) 40 MG tablet Take 1 tablet (40 mg total) by mouth daily. 90 tablet 3  . metoprolol tartrate (LOPRESSOR) 25 MG tablet TAKE 0.5 TABLETS BY MOUTH (12.5 MG TOTAL) BY MOUTH 2 TIMES DAILY 90 tablet 3  . Multiple Vitamin (MULTI VITAMIN MENS PO) Take 1 tablet by mouth daily.    . nitroGLYCERIN (NITROSTAT) 0.4 MG SL tablet Place 1 tablet (0.4 mg total) under the tongue every 5 (five) minutes x 3 doses as needed for chest pain. 25 tablet 3  . ramipril (ALTACE) 5 MG capsule Take 1 capsule (5 mg total) by mouth daily. 90 capsule 3   No current facility-administered medications for this visit.    Past Medical History:  Diagnosis Date  . Bradycardia   . CAD (coronary artery disease)    cath 02/25/2015 50% mid LAD, 100% distal RCA after takeoff of the PDA treated with DES (2.520 mm Synergy DES stent from distal RCA to PLA2, 2.2524 mm Synergy DES stent to PLA1) with transient dissection in the  more distal PLA2 treated with PTCA.  Marland Kitchen Hypercholesterolemia   . Hypertension     Past Surgical History:  Procedure Laterality Date  . CARDIAC CATHETERIZATION N/A 02/25/2015   Procedure: Left Heart Cath and Coronary Angiography;  Surgeon: [name], MD;  [facility]VAS[facility] CV LAB;  Service: Cardiovascular;  Laterality: N/A;  . CARDIAC CATHETERIZATION N/A 02/25/2015   Procedure: Coronary Stent Intervention;  Surgeon: [name], MD;  Location: [facility] INVASIVE CV LAB;  Service: Cardiovascular;  Laterality: N/A;  . TONSILLECTOMY      Social History   Socioeconomic History  . Marital status: Significant Other    Spouse name: Not on file  . Number of children: Not on file  . Years of education: Not on file  . Highest education level: Not on file  Occupational History  . Not on file  Tobacco Use  . Smoking status: Never Smoker  . Smokeless tobacco: Never Used  Vaping Use  . Vaping Use: Never used  Substance and Sexual Activity  . Alcohol use: Yes    Alcohol/week: 0.0 standard drinks    Comment: socially  . Drug use: No  . Sexual activity: Not on file  Other Topics Concern  . Not on file  Social History Narrative  . Not on file   Social Determinants of  Health   Financial Resource Strain: Not on file  Food Insecurity: Not on file  Transportation Needs: Not on file  Physical Activity: Not on file  Stress: Not on file  Social Connections: Not on file  Intimate Partner Violence: Not on file     Vitals:   11/11/20 0916  BP: 134/82  Pulse: 61  SpO2: 96%  Weight: 116.6 kg  Height: 5\' 11"  (1.803 m)    Wt Readings from Last 3 Encounters:  11/11/20 116.6 kg  08/29/19 115.7 kg  01/26/18 114.3 kg     PHYSICAL EXAM Affect appropriate Healthy:  appears stated age HEENT: normal Neck supple with no adenopathy JVP normal no bruits no thyromegaly Lungs clear with no wheezing and good diaphragmatic motion Heart:  S1/S2 no murmur, no rub, gallop or click PMI  normal Abdomen: benighn, BS positve, no tenderness, no AAA no bruit.  No HSM or HJR Distal pulses intact with no bruits No edema Neuro non-focal Skin warm and dry No muscular weakness   Labs: Lab Results  Component Value Date/Time   K 4.1 03/05/2015 09:27 AM   BUN 19 03/05/2015 09:27 AM   CREATININE 0.95 03/05/2015 09:27 AM   ALT 36 02/24/2015 01:06 AM   TSH 3.590 02/24/2015 01:06 AM   HGB 13.7 02/26/2015 03:32 AM     Lipids: Lab Results  Component Value Date/Time   LDLCALC 76 11/06/2020 08:40 AM   CHOL 145 11/06/2020 08:40 AM   TRIG 121 11/06/2020 08:40 AM   HDL 47 11/06/2020 08:40 AM       ASSESSMENT AND PLAN: 1.  Coronary disease with history of non-STEMI and PCI of the right coronary artery 2016 : given residual LAD disease favor f/u Ex myovue continue ASA beta blocker and statin   2.  Hypertension: Well controlled.  Continue current medications and low sodium Dash type diet.    3.  Hyperlipidemia: I will check lipids.  Continue Lipitor.  4.  Morbid obesity: discussed low carb diet and exercise refer to healthy weight [facility] at [facility]    Ex Myovue   Disposition: Follow up 1 yr   [facility] MD [facility]

## 2020-11-06 ENCOUNTER — Other Ambulatory Visit: Payer: Self-pay | Admitting: Cardiovascular Disease

## 2020-11-07 LAB — LIPID PANEL W/O CHOL/HDL RATIO
Cholesterol, Total: 145 mg/dL (ref 100–199)
HDL: 47 mg/dL (ref >39)
LDL Chol Calc (NIH): 76 mg/dL (ref 0–99)
Triglycerides: 121 mg/dL (ref 0–149)
VLDL Cholesterol Cal: 22 mg/dL (ref 5–40)

## 2020-11-07 LAB — SPECIMEN STATUS REPORT

## 2020-11-11 ENCOUNTER — Ambulatory Visit (INDEPENDENT_AMBULATORY_CARE_PROVIDER_SITE_OTHER): Payer: 59 | Admitting: Cardiovascular Disease

## 2020-11-11 ENCOUNTER — Other Ambulatory Visit: Payer: Self-pay

## 2020-11-11 ENCOUNTER — Encounter: Payer: Self-pay | Admitting: Cardiovascular Disease

## 2020-11-11 VITALS — BP 134/82 | HR 61 | Ht 71.0 in | Wt 257.0 lb

## 2020-11-11 DIAGNOSIS — I25118 Atherosclerotic heart disease of native coronary artery with other forms of angina pectoris: Secondary | ICD-10-CM

## 2020-11-11 DIAGNOSIS — E782 Mixed hyperlipidemia: Secondary | ICD-10-CM

## 2020-11-11 MED ORDER — NITROGLYCERIN 0.4 MG SL SUBL: 0.4000 mg | SUBLINGUAL_TABLET | SUBLINGUAL | 3 refills | Status: DC | PRN

## 2020-11-11 NOTE — Patient Instructions (Addendum)
Medication Instructions:  Your physician recommends that you continue on your current medications as directed. Please refer to the Current Medication list given to you today.  *If you need a refill on your cardiac medications before your next appointment, please call your pharmacy*   Lab Work: None today  If you have labs (blood work) drawn today and your tests are completely normal, you will receive your results only by: MyChart Message (if you have MyChart) OR A paper copy in the mail If you have any lab test that is abnormal or we need to change your treatment, we will call you to review the results.   Testing/Procedures: Your physician has requested that you have en exercise stress myoview. For further information please visit [email or web address]. Please follow instruction sheet, as given.    Follow-Up: At [facility], you and your health needs are our priority.  As part of our continuing mission to provide you with exceptional heart care, we have created designated Provider Care Teams.  These Care Teams include your primary Cardiologist (physician) and Advanced Practice Providers (APPs -  Physician Assistants and Nurse Practitioners) who all work together to provide you with the care you need, when you need it.  We recommend signing up for the patient portal called "MyChart".  Sign up information is provided on this After Visit Summary.  MyChart is used to connect with patients for Virtual Visits (Telemedicine).  Patients are able to view lab/test results, encounter notes, upcoming appointments, etc.  Non-urgent messages can be sent to your provider as well.   To learn more about what you can do with MyChart, go to [email or web address].    Your next appointment:   12 month(s)  The format for your next appointment:   In Person  Provider:   Peter Nishan, MD   Other Instructions None      

## 2020-11-18 ENCOUNTER — Other Ambulatory Visit (HOSPITAL_COMMUNITY)
Admission: RE | Admit: 2020-11-18 | Discharge: 2020-11-18 | Disposition: A | Discharge status: Home / Self Care | Payer: 59 | Source: Ambulatory Visit | Attending: Cardiovascular Disease | Admitting: Cardiovascular Disease

## 2020-11-18 ENCOUNTER — Other Ambulatory Visit: Payer: Self-pay

## 2020-11-18 DIAGNOSIS — Z01812 Encounter for preprocedural laboratory examination: Secondary | ICD-10-CM | POA: Insufficient documentation

## 2020-11-18 DIAGNOSIS — Z20822 Contact with and (suspected) exposure to covid-19: Secondary | ICD-10-CM | POA: Insufficient documentation

## 2020-11-19 LAB — SARS CORONAVIRUS 2 (TAT 6-24 HRS): SARS Coronavirus 2: NEGATIVE

## 2020-11-21 ENCOUNTER — Telehealth: Payer: Self-pay

## 2020-11-21 ENCOUNTER — Other Ambulatory Visit: Payer: Self-pay

## 2020-11-21 ENCOUNTER — Ambulatory Visit (HOSPITAL_COMMUNITY)
Admission: RE | Admit: 2020-11-21 | Discharge: 2020-11-21 | Disposition: A | Discharge status: Home / Self Care | Payer: 59 | Source: Ambulatory Visit | Attending: Cardiovascular Disease | Admitting: Cardiovascular Disease

## 2020-11-21 ENCOUNTER — Encounter (HOSPITAL_COMMUNITY): Payer: Self-pay

## 2020-11-21 DIAGNOSIS — I25118 Atherosclerotic heart disease of native coronary artery with other forms of angina pectoris: Secondary | ICD-10-CM | POA: Insufficient documentation

## 2020-11-21 LAB — NM MYOCAR MULTI W/SPECT W/WALL MOTION / EF
Estimated workload: 10.4 METS
Exercise duration (min): 8 min
Exercise duration (sec): 14 s
LV dias vol: 124 mL (ref 62–150)
LV sys vol: 59 mL
MPHR: 159 {beats}/min
Peak HR: 139 {beats}/min
Percent HR: 87 %
RATE: 0.4
RPE: 16
Rest HR: 55 {beats}/min
SDS: 3
SRS: 0
SSS: 3
TID: 1.14

## 2020-11-21 MED ORDER — REGADENOSON 0.4 MG/5ML IV SOLN
INTRAVENOUS | Status: AC
  Filled 2020-11-21: qty 5

## 2020-11-21 MED ORDER — SODIUM CHLORIDE FLUSH 0.9 % IV SOLN
INTRAVENOUS | Status: AC
  Administered 2020-11-21: 10 mL via INTRAVENOUS
  Filled 2020-11-21: qty 10

## 2020-11-21 MED ORDER — TECHNETIUM TC 99M TETROFOSMIN IV KIT
30.0000 | PACK | Freq: Once | INTRAVENOUS | Status: AC | PRN
  Administered 2020-11-21: 32 via INTRAVENOUS

## 2020-11-21 MED ORDER — TECHNETIUM TC 99M TETROFOSMIN IV KIT
10.0000 | PACK | Freq: Once | INTRAVENOUS | Status: AC | PRN
  Administered 2020-11-21: 11 via INTRAVENOUS

## 2020-11-21 NOTE — Telephone Encounter (Signed)
Patient has agreed to come in for an appointment to discuss LHC. Patient is scheduled with Leda Gauze, PA-C on 12/16/2020 at 2:00pm.

## 2020-11-21 NOTE — Telephone Encounter (Signed)
-----   Message from Wendall Stade, MD sent at 11/21/2020 12:43 PM EDT ----- Inferior ischemia and history of stent in this area Should have left heart cath ? Can brittany see and arrange

## 2020-11-28 NOTE — Progress Notes (Signed)
Cardiology Office Note    Date:  11/30/2020   ID:  Kashaun, Bebo 1961/08/02, MRN [id number]  PCP:  Assunta Found, MD  Cardiologist: Charlton Haws, MD    Chief Complaint  Patient presents with  . Follow-up    Abnormal Stress Test    History of Present Illness:    Dennis Morrow is a 60 y.o. male with past medical history of CAD (s/p NSTEMI in 02/2015 with DES to distal RCA extending into the PLA 2 vessel and transient dissection in the more distal PLA 2 treated with PTCA and DES to PLA1), HTN, HLD and obesity who presents to the office today for follow-up from his recent stress test.  He was examined by Dr. 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 His EKG did show TWI along the inferior leads and given the timeframe since his last ischemic evaluation, an Exercise Myoview was recommended for further evaluation. This showed no ST changes during the exercise portion but imaging did show findings consistent with prior inferior/inferior lateral and apex myocardial infarction with mild to moderate peri-infarct ischemia and Dr. Eden Emms recommended a cardiac catheterization for definitive evaluation.  In talking with the patient today, he reports having bilateral shoulder pain but this is typically worse with positional changes and improves with heat or massage. He denies any specific chest pain or dyspnea on exertion. Says he can tell when he needs to stop. 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 No recent orthopnea, PND or pitting edema.   Past Medical History:  Diagnosis Date  . Bradycardia   . CAD (coronary artery disease)    cath 02/25/2015 50% mid LAD, 100% distal RCA after takeoff of the PDA treated with DES (2.520 mm Synergy DES stent from distal RCA to PLA2, 2.2524 mm Synergy DES stent to PLA1) with transient dissection in the more distal  PLA2 treated with PTCA.  Marland Kitchen Hypercholesterolemia   . Hypertension     Past Surgical History:  Procedure Laterality Date  . CARDIAC CATHETERIZATION N/A 02/25/2015   Procedure: Left Heart Cath and Coronary Angiography;  Surgeon: [name], MD;  Location: [facility] INVASIVE CV LAB;  Service: Cardiovascular;  Laterality: N/A;  . CARDIAC CATHETERIZATION N/A 02/25/2015   Procedure: Coronary Stent Intervention;  Surgeon: [name], MD;  Location: [facility] INVASIVE CV LAB;  Service: Cardiovascular;  Laterality: N/A;  . TONSILLECTOMY      Current Medications: Outpatient Medications Prior to Visit  Medication Sig Dispense Refill  . albuterol (VENTOLIN HFA) 108 (90 Base) MCG/ACT inhaler Inhale 2 puffs into the lungs.    Marland Kitchen aspirin EC 81 MG EC tablet Take 1 tablet (81 mg total) by mouth daily.    . metoprolol tartrate (LOPRESSOR) 25 MG tablet TAKE 0.5 TABLETS BY MOUTH (12.5 MG TOTAL) BY MOUTH 2 TIMES DAILY 90 tablet 3  . Multiple Vitamin (MULTI VITAMIN MENS PO) Take 1 tablet by mouth daily.    . nitroGLYCERIN (NITROSTAT) 0.4 MG SL tablet Place 1 tablet (0.4 mg total) under the tongue every 5 (five) minutes x 3 doses as needed for chest pain. 25 tablet 3  . atorvastatin (LIPITOR) 40 MG tablet Take 1 tablet (40 mg total) by mouth daily. 90 tablet 3  . ramipril (ALTACE) 5 MG capsule Take 1 capsule (5 mg total) by mouth daily. 90 capsule 3  No facility-administered medications prior to visit.     Allergies:   Patient has no known allergies.   Social History   Socioeconomic History  . Marital status: Married    Spouse name: Not on file  . Number of children: Not on file  . Years of education: Not on file  . Highest education level: Not on file  Occupational History  . Not on file  Tobacco Use  . Smoking status: Never Smoker  . Smokeless tobacco: Never Used  Vaping Use  . Vaping Use: Never used  Substance and Sexual Activity  . Alcohol use: Yes    Alcohol/week: 0.0 standard drinks    Comment:  socially  . Drug use: No  . Sexual activity: Not on file  Other Topics Concern  . Not on file  Social History Narrative  . Not on file   Social Determinants of Health   Financial Resource Strain: Not on file  Food Insecurity: Not on file  Transportation Needs: Not on file  Physical Activity: Not on file  Stress: Not on file  Social Connections: Not on file     Family History:  The patient's family history includes CVA in his mother.   Review of Systems:    Please see the history of present illness.     All other systems reviewed and are otherwise negative except as noted above.   Physical Exam:    VS:  BP 120/62   Pulse 72   Ht 5' 11" (1.803 m)   Wt 255 lb (115.7 kg)   SpO2 97%   BMI 35.57 kg/m    General: Well developed, well nourished,male appearing in no acute distress. Head: Normocephalic, atraumatic. Neck: No carotid bruits. JVD not elevated.  Lungs: Respirations regular and unlabored, without wheezes or rales.  Heart: Regular rate and rhythm. No S3 or S4.  No murmur, no rubs, or gallops appreciated. Abdomen: Appears non-distended. No obvious abdominal masses. Msk:  Strength and tone appear normal for age. No obvious joint deformities or effusions. Extremities: No clubbing or cyanosis. No pitting edema.  Distal pedal pulses are 2+ bilaterally. Neuro: Alert and oriented X 3. Moves all extremities spontaneously. No focal deficits noted. Psych:  Responds to questions appropriately with a normal affect. Skin: No rashes or lesions noted  Wt Readings from Last 3 Encounters:  11/29/20 255 lb (115.7 kg)  11/11/20 257 lb (116.6 kg)  08/29/19 255 lb (115.7 kg)     Studies/Labs Reviewed:   EKG:  EKG is not ordered today.  EKG from stress test reviewed dated 11/21/2020.  Recent Labs: No results found for requested labs within last 8760 hours.   Lipid Panel    Component Value Date/Time   CHOL 145 11/06/2020 0840   TRIG 121 11/06/2020 0840   HDL 47 11/06/2020  0840   CHOLHDL 2.9 01/26/2018 0951   VLDL 19 01/26/2018 0951   LDLCALC 76 11/06/2020 0840    Additional studies/ records that were reviewed today include:   Cardiac Catheterization: 02/2015  Mid LAD lesion, 50% stenosed.  Post Atrio lesion, 100% stenosed. There is a 0% residual stenosis post intervention.  A drug-eluting stent was placed.  RPDA lesion, 85% stenosed. There is a 0% residual stenosis post intervention.  The left ventricular systolic function is normal.   Preserved global LV contractility without focal segmental wall motion abnormalities, ejection fraction 55%  2 vessel cardiac disease with 50% proximal LAD stenosis; normal left circumflex coronary artery; and dominant RCA with total   occlusion in the distal RCA continuation branch  after the takeoff of the PDA vessel with TIMI 0 flow and very faint left-to-right collaterals seen to supply 2 vessels, and diffuse 90% stenoses in the mid PDA vessel.  Difficult but successful intervention to the RCA with ultimate PTCA/stenting of the distal RCA extending into the PLA 2 vessel with a 2.520 mm Synergy DES stent with the 100% occlusion being reduced to 0% and with transient dissection in the more distal PLA 2 treated with PTCA;  PTCA  of the PLA 1 vessel with 100% occlusion being reduced to 30% proximally and PTCA/DES stenting of the diffuse 90% PDA vessel stenoses being reduced to 0% with ultimate insertion of a 2.2524 mm Synergy DES stent.  RECOMMENDATION:  Patient will continue with dual antiplatelets therapy for minimum of one-year a preferably longer.  Medical therapy for concomitant CAD.  Aggressive lipid-lowering therapy, blood pressure control.   NST: 11/2020  Blood pressure demonstrated a normal response to exercise.  There was no ST segment deviation noted during stress.  Findings consistent with prior inferior/inferolateral into apex myocardial infarction with mild to moderate peri-infarct  ischemia.  This is a low to intermediate risk study based on perfusion imaging. Duke treadill score of 8 would support low risk for major cardiac events.  The left ventricular ejection fraction is normal (55-65%).   Assessment:    1. Abnormal stress test   2. Coronary artery disease involving native coronary artery of native heart without angina pectoris   3. Pre-op testing   4. Essential hypertension   5. Hyperlipidemia LDL goal <70      Plan:   In order of problems listed above:  1. Abnormal Stress Test - Recent stress test showed findings consistent with prior inferior/inferior lateral and apex myocardial infarction with mild to moderate peri-infarct ischemia with cardiac catheterization recommended by Dr. Eden Emms.  - Reviewed stress test results with the patient today and he is in agreement to proceed with cath. Risks and benefits reviewed. He requests to have this in 2 weeks which seems appropriate at this time given no specific anginal symptoms per his report.  - Continue ASA, Atorvastatin, Lopressor and PRN SL NTG.   2. CAD - He is s/p NSTEMI in 02/2015 with DES to distal RCA extending into the PLA 2 vessel and transient dissection in the more distal PLA 2 treated with PTCA and DES to PLA1. - He denies any recent chest pain resembling his prior NSTEMI but recent stress test was abnormal so planning for repeat cath as outlined above.  - Continue ASA 81mg  daily, Atorvastatin 40mg  daily and Lopressor 12.5mg  BID.   3. HTN - BP is well-controlled at 120/62 during today's visit. Continue current medication regimen with Lopressor 12.5mg  BID and Ramipril 5mg  daily.   4. HLD - FLP earlier this month showed LDL was at 76. He is currently on Atorvastatin 40mg  daily and pending cath results, would consider further titration to 80mg  daily.    Shared Decision Making/Informed Consent:   Shared Decision Making/Informed Consent The risks [stroke (1 in 1000), death (1 in 1000), kidney  failure [usually temporary] (1 in 500), bleeding (1 in 200), allergic reaction [possibly serious] (1 in 200)], benefits (diagnostic support and management of coronary artery disease) and alternatives of a cardiac catheterization were discussed in detail with Dennis Morrow and he is willing to proceed.    Medication Adjustments/Labs and Tests Ordered: Current medicines are reviewed at length with the patient today.  Concerns  regarding medicines are outlined above.  Medication changes, Labs and Tests ordered today are listed in the Patient Instructions below. Patient Instructions  Medication Instructions:  Your physician recommends that you continue on your current medications as directed. Please refer to the Current Medication list given to you today.  *If you need a refill on your cardiac medications before your next appointment, please call your pharmacy*   Lab Work: Cbc,cmet on Monday or Tuesday  5/2 or 5/3  If you have labs (blood work) drawn today and your tests are completely normal, you will receive your results only by: Marland Kitchen MyChart Message (if you have MyChart) OR . A paper copy in the mail If you have any lab test that is abnormal or we need to change your treatment, we will call you to review the results.   Testing/Procedures: Your physician has requested that you have a cardiac catheterization. Cardiac catheterization is used to diagnose and/or treat various heart conditions. Doctors may recommend this procedure for a number of different reasons. The most common reason is to evaluate chest pain. Chest pain can be a symptom of coronary artery disease (CAD), and cardiac catheterization can show whether plaque is narrowing or blocking your heart's arteries. This procedure is also used to evaluate the valves, as well as measure the blood flow and oxygen levels in different parts of your heart. For further information please visit [email or web address] Please follow instruction sheet, as  given.     Follow-Up: At [facility], you and your health needs are our priority.  As part of our continuing mission to provide you with exceptional heart care, we have created designated Provider Care Teams.  These Care Teams include your primary Cardiologist (physician) and Advanced Practice Providers (APPs -  Physician Assistants and Nurse Practitioners) who all work together to provide you with the care you need, when you need it.  We recommend signing up for the patient portal called "MyChart".  Sign up information is provided on this After Visit Summary.  MyChart is used to connect with patients for Virtual Visits (Telemedicine).  Patients are able to view lab/test results, encounter notes, upcoming appointments, etc.  Non-urgent messages can be sent to your provider as well.   To learn more about what you can do with MyChart, go to [email or web address].    Your next appointment:     The format for your next appointment:   In Person  Provider:   Randall An, PA-C   Other Instructions None      Signed, [name], PA-C  11/30/2020 2:24 PM    [facility] [address] [place], [place] [address] Phone: [phone] Fax: [phone]

## 2020-11-28 NOTE — H&P (View-Only) (Signed)
Cardiology Office Note    Date:  11/30/2020   ID:  Dennis Morrow, Dennis Morrow 1961/08/02, MRN [id number]  PCP:  Assunta Found, MD  Cardiologist: Charlton Haws, MD    Chief Complaint  Patient presents with  . Follow-up    Abnormal Stress Test    History of Present Illness:    Dennis Morrow is a 60 y.o. male with past medical history of CAD (s/p NSTEMI in 02/2015 with DES to distal RCA extending into the PLA 2 vessel and transient dissection in the more distal PLA 2 treated with PTCA and DES to PLA1), HTN, HLD and obesity who presents to the office today for follow-up from his recent stress test.  He was examined by Dr. 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 His EKG did show TWI along the inferior leads and given the timeframe since his last ischemic evaluation, an Exercise Myoview was recommended for further evaluation. This showed no ST changes during the exercise portion but imaging did show findings consistent with prior inferior/inferior lateral and apex myocardial infarction with mild to moderate peri-infarct ischemia and Dr. Eden Emms recommended a cardiac catheterization for definitive evaluation.  In talking with the patient today, he reports having bilateral shoulder pain but this is typically worse with positional changes and improves with heat or massage. He denies any specific chest pain or dyspnea on exertion. Says he can tell when he needs to stop. 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 No recent orthopnea, PND or pitting edema.   Past Medical History:  Diagnosis Date  . Bradycardia   . CAD (coronary artery disease)    cath 02/25/2015 50% mid LAD, 100% distal RCA after takeoff of the PDA treated with DES (2.520 mm Synergy DES stent from distal RCA to PLA2, 2.2524 mm Synergy DES stent to PLA1) with transient dissection in the more distal  PLA2 treated with PTCA.  Marland Kitchen Hypercholesterolemia   . Hypertension     Past Surgical History:  Procedure Laterality Date  . CARDIAC CATHETERIZATION N/A 02/25/2015   Procedure: Left Heart Cath and Coronary Angiography;  Surgeon: [name], MD;  Location: [facility] INVASIVE CV LAB;  Service: Cardiovascular;  Laterality: N/A;  . CARDIAC CATHETERIZATION N/A 02/25/2015   Procedure: Coronary Stent Intervention;  Surgeon: [name], MD;  Location: [facility] INVASIVE CV LAB;  Service: Cardiovascular;  Laterality: N/A;  . TONSILLECTOMY      Current Medications: Outpatient Medications Prior to Visit  Medication Sig Dispense Refill  . albuterol (VENTOLIN HFA) 108 (90 Base) MCG/ACT inhaler Inhale 2 puffs into the lungs.    Marland Kitchen aspirin EC 81 MG EC tablet Take 1 tablet (81 mg total) by mouth daily.    . metoprolol tartrate (LOPRESSOR) 25 MG tablet TAKE 0.5 TABLETS BY MOUTH (12.5 MG TOTAL) BY MOUTH 2 TIMES DAILY 90 tablet 3  . Multiple Vitamin (MULTI VITAMIN MENS PO) Take 1 tablet by mouth daily.    . nitroGLYCERIN (NITROSTAT) 0.4 MG SL tablet Place 1 tablet (0.4 mg total) under the tongue every 5 (five) minutes x 3 doses as needed for chest pain. 25 tablet 3  . atorvastatin (LIPITOR) 40 MG tablet Take 1 tablet (40 mg total) by mouth daily. 90 tablet 3  . ramipril (ALTACE) 5 MG capsule Take 1 capsule (5 mg total) by mouth daily. 90 capsule 3  No facility-administered medications prior to visit.     Allergies:   Patient has no known allergies.   Social History   Socioeconomic History  . Marital status: Married    Spouse name: Not on file  . Number of children: Not on file  . Years of education: Not on file  . Highest education level: Not on file  Occupational History  . Not on file  Tobacco Use  . Smoking status: Never Smoker  . Smokeless tobacco: Never Used  Vaping Use  . Vaping Use: Never used  Substance and Sexual Activity  . Alcohol use: Yes    Alcohol/week: 0.0 standard drinks    Comment:  socially  . Drug use: No  . Sexual activity: Not on file  Other Topics Concern  . Not on file  Social History Narrative  . Not on file   Social Determinants of Health   Financial Resource Strain: Not on file  Food Insecurity: Not on file  Transportation Needs: Not on file  Physical Activity: Not on file  Stress: Not on file  Social Connections: Not on file     Family History:  The patient's family history includes CVA in his mother.   Review of Systems:    Please see the history of present illness.     All other systems reviewed and are otherwise negative except as noted above.   Physical Exam:    VS:  BP 120/62   Pulse 72   Ht 5\' 11"  (1.803 m)   Wt 255 lb (115.7 kg)   SpO2 97%   BMI 35.57 kg/m    General: Well developed, well nourished,male appearing in no acute distress. Head: Normocephalic, atraumatic. Neck: No carotid bruits. JVD not elevated.  Lungs: Respirations regular and unlabored, without wheezes or rales.  Heart: Regular rate and rhythm. No S3 or S4.  No murmur, no rubs, or gallops appreciated. Abdomen: Appears non-distended. No obvious abdominal masses. Msk:  Strength and tone appear normal for age. No obvious joint deformities or effusions. Extremities: No clubbing or cyanosis. No pitting edema.  Distal pedal pulses are 2+ bilaterally. Neuro: Alert and oriented X 3. Moves all extremities spontaneously. No focal deficits noted. Psych:  Responds to questions appropriately with a normal affect. Skin: No rashes or lesions noted  Wt Readings from Last 3 Encounters:  11/29/20 255 lb (115.7 kg)  11/11/20 257 lb (116.6 kg)  08/29/19 255 lb (115.7 kg)     Studies/Labs Reviewed:   EKG:  EKG is not ordered today.  EKG from stress test reviewed dated 11/21/2020.  Recent Labs: No results found for requested labs within last 8760 hours.   Lipid Panel    Component Value Date/Time   CHOL 145 11/06/2020 0840   TRIG 121 11/06/2020 0840   HDL 47 11/06/2020  0840   CHOLHDL 2.9 01/26/2018 0951   VLDL 19 01/26/2018 0951   LDLCALC 76 11/06/2020 0840    Additional studies/ records that were reviewed today include:   Cardiac Catheterization: 02/2015  Mid LAD lesion, 50% stenosed.  Post Atrio lesion, 100% stenosed. There is a 0% residual stenosis post intervention.  A drug-eluting stent was placed.  RPDA lesion, 85% stenosed. There is a 0% residual stenosis post intervention.  The left ventricular systolic function is normal.   Preserved global LV contractility without focal segmental wall motion abnormalities, ejection fraction 55%  2 vessel cardiac disease with 50% proximal LAD stenosis; normal left circumflex coronary artery; and dominant RCA with total  occlusion in the distal RCA continuation branch  after the takeoff of the PDA vessel with TIMI 0 flow and very faint left-to-right collaterals seen to supply 2 vessels, and diffuse 90% stenoses in the mid PDA vessel.  Difficult but successful intervention to the RCA with ultimate PTCA/stenting of the distal RCA extending into the PLA 2 vessel with a 2.520 mm Synergy DES stent with the 100% occlusion being reduced to 0% and with transient dissection in the more distal PLA 2 treated with PTCA;  PTCA  of the PLA 1 vessel with 100% occlusion being reduced to 30% proximally and PTCA/DES stenting of the diffuse 90% PDA vessel stenoses being reduced to 0% with ultimate insertion of a 2.2524 mm Synergy DES stent.  RECOMMENDATION:  Patient will continue with dual antiplatelets therapy for minimum of one-year a preferably longer.  Medical therapy for concomitant CAD.  Aggressive lipid-lowering therapy, blood pressure control.   NST: 11/2020  Blood pressure demonstrated a normal response to exercise.  There was no ST segment deviation noted during stress.  Findings consistent with prior inferior/inferolateral into apex myocardial infarction with mild to moderate peri-infarct  ischemia.  This is a low to intermediate risk study based on perfusion imaging. Duke treadill score of 8 would support low risk for major cardiac events.  The left ventricular ejection fraction is normal (55-65%).   Assessment:    1. Abnormal stress test   2. Coronary artery disease involving native coronary artery of native heart without angina pectoris   3. Pre-op testing   4. Essential hypertension   5. Hyperlipidemia LDL goal <70      Plan:   In order of problems listed above:  1. Abnormal Stress Test - Recent stress test showed findings consistent with prior inferior/inferior lateral and apex myocardial infarction with mild to moderate peri-infarct ischemia with cardiac catheterization recommended by Dr. Eden Emms.  - Reviewed stress test results with the patient today and he is in agreement to proceed with cath. Risks and benefits reviewed. He requests to have this in 2 weeks which seems appropriate at this time given no specific anginal symptoms per his report.  - Continue ASA, Atorvastatin, Lopressor and PRN SL NTG.   2. CAD - He is s/p NSTEMI in 02/2015 with DES to distal RCA extending into the PLA 2 vessel and transient dissection in the more distal PLA 2 treated with PTCA and DES to PLA1. - He denies any recent chest pain resembling his prior NSTEMI but recent stress test was abnormal so planning for repeat cath as outlined above.  - Continue ASA 81mg  daily, Atorvastatin 40mg  daily and Lopressor 12.5mg  BID.   3. HTN - BP is well-controlled at 120/62 during today's visit. Continue current medication regimen with Lopressor 12.5mg  BID and Ramipril 5mg  daily.   4. HLD - FLP earlier this month showed LDL was at 76. He is currently on Atorvastatin 40mg  daily and pending cath results, would consider further titration to 80mg  daily.    Shared Decision Making/Informed Consent:   Shared Decision Making/Informed Consent The risks [stroke (1 in 1000), death (1 in 1000), kidney  failure [usually temporary] (1 in 500), bleeding (1 in 200), allergic reaction [possibly serious] (1 in 200)], benefits (diagnostic support and management of coronary artery disease) and alternatives of a cardiac catheterization were discussed in detail with Mr. Hetz and he is willing to proceed.    Medication Adjustments/Labs and Tests Ordered: Current medicines are reviewed at length with the patient today.  Concerns  regarding medicines are outlined above.  Medication changes, Labs and Tests ordered today are listed in the Patient Instructions below. Patient Instructions  Medication Instructions:  Your physician recommends that you continue on your current medications as directed. Please refer to the Current Medication list given to you today.  *If you need a refill on your cardiac medications before your next appointment, please call your pharmacy*   Lab Work: Cbc,cmet on Monday or Tuesday  5/2 or 5/3  If you have labs (blood work) drawn today and your tests are completely normal, you will receive your results only by: Marland Kitchen MyChart Message (if you have MyChart) OR . A paper copy in the mail If you have any lab test that is abnormal or we need to change your treatment, we will call you to review the results.   Testing/Procedures: Your physician has requested that you have a cardiac catheterization. Cardiac catheterization is used to diagnose and/or treat various heart conditions. Doctors may recommend this procedure for a number of different reasons. The most common reason is to evaluate chest pain. Chest pain can be a symptom of coronary artery disease (CAD), and cardiac catheterization can show whether plaque is narrowing or blocking your heart's arteries. This procedure is also used to evaluate the valves, as well as measure the blood flow and oxygen levels in different parts of your heart. For further information please visit [email or web address] Please follow instruction sheet, as  given.     Follow-Up: At [facility], you and your health needs are our priority.  As part of our continuing mission to provide you with exceptional heart care, we have created designated Provider Care Teams.  These Care Teams include your primary Cardiologist (physician) and Advanced Practice Providers (APPs -  Physician Assistants and Nurse Practitioners) who all work together to provide you with the care you need, when you need it.  We recommend signing up for the patient portal called "MyChart".  Sign up information is provided on this After Visit Summary.  MyChart is used to connect with patients for Virtual Visits (Telemedicine).  Patients are able to view lab/test results, encounter notes, upcoming appointments, etc.  Non-urgent messages can be sent to your provider as well.   To learn more about what you can do with MyChart, go to [email or web address].    Your next appointment:     The format for your next appointment:   In Person  Provider:   Randall An, PA-C   Other Instructions None      Signed, [name], PA-C  11/30/2020 2:24 PM    [facility] [address] [place], [place] [address] Phone: [phone] Fax: [phone]

## 2020-11-29 ENCOUNTER — Other Ambulatory Visit: Payer: Self-pay

## 2020-11-29 ENCOUNTER — Ambulatory Visit (INDEPENDENT_AMBULATORY_CARE_PROVIDER_SITE_OTHER): Payer: 59 | Admitting: Student

## 2020-11-29 ENCOUNTER — Encounter: Payer: Self-pay | Admitting: Student

## 2020-11-29 VITALS — BP 120/62 | HR 72 | Ht 71.0 in | Wt 255.0 lb

## 2020-11-29 DIAGNOSIS — R9439 Abnormal result of other cardiovascular function study: Secondary | ICD-10-CM

## 2020-11-29 DIAGNOSIS — I2510 Atherosclerotic heart disease of native coronary artery without angina pectoris: Secondary | ICD-10-CM | POA: Diagnosis not present

## 2020-11-29 DIAGNOSIS — I10 Essential (primary) hypertension: Secondary | ICD-10-CM | POA: Diagnosis not present

## 2020-11-29 DIAGNOSIS — Z01818 Encounter for other preprocedural examination: Secondary | ICD-10-CM

## 2020-11-29 DIAGNOSIS — E785 Hyperlipidemia, unspecified: Secondary | ICD-10-CM

## 2020-11-29 MED ORDER — RAMIPRIL 5 MG PO CAPS: 5.0000 mg | ORAL_CAPSULE | Freq: Every day | ORAL | 3 refills | Status: DC

## 2020-11-29 MED ORDER — ATORVASTATIN CALCIUM 40 MG PO TABS: 40.0000 mg | ORAL_TABLET | Freq: Every day | ORAL | 3 refills | Status: DC

## 2020-11-29 NOTE — Patient Instructions (Signed)
Medication Instructions:  Your physician recommends that you continue on your current medications as directed. Please refer to the Current Medication list given to you today.  *If you need a refill on your cardiac medications before your next appointment, please call your pharmacy*   Lab Work: Cbc,cmet on Monday or Tuesday  5/2 or 5/3  If you have labs (blood work) drawn today and your tests are completely normal, you will receive your results only by: Marland Kitchen MyChart Message (if you have MyChart) OR . A paper copy in the mail If you have any lab test that is abnormal or we need to change your treatment, we will call you to review the results.   Testing/Procedures: Your physician has requested that you have a cardiac catheterization. Cardiac catheterization is used to diagnose and/or treat various heart conditions. Doctors may recommend this procedure for a number of different reasons. The most common reason is to evaluate chest pain. Chest pain can be a symptom of coronary artery disease (CAD), and cardiac catheterization can show whether plaque is narrowing or blocking your heart's arteries. This procedure is also used to evaluate the valves, as well as measure the blood flow and oxygen levels in different parts of your heart. For further information please visit [email or web address] Please follow instruction sheet, as given.     Follow-Up: At [facility], you and your health needs are our priority.  As part of our continuing mission to provide you with exceptional heart care, we have created designated Provider Care Teams.  These Care Teams include your primary Cardiologist (physician) and Advanced Practice Providers (APPs -  Physician Assistants and Nurse Practitioners) who all work together to provide you with the care you need, when you need it.  We recommend signing up for the patient portal called "MyChart".  Sign up information is provided on this After Visit Summary.  MyChart is used  to connect with patients for Virtual Visits (Telemedicine).  Patients are able to view lab/test results, encounter notes, upcoming appointments, etc.  Non-urgent messages can be sent to your provider as well.   To learn more about what you can do with MyChart, go to [email or web address].    Your next appointment:     The format for your next appointment:   In Person  Provider:   Randall An, PA-C   Other Instructions None

## 2020-11-30 ENCOUNTER — Encounter: Payer: Self-pay | Admitting: Student

## 2020-12-09 ENCOUNTER — Other Ambulatory Visit: Payer: Self-pay | Admitting: Student

## 2020-12-10 ENCOUNTER — Telehealth: Payer: Self-pay

## 2020-12-10 ENCOUNTER — Other Ambulatory Visit (HOSPITAL_COMMUNITY)
Admission: RE | Admit: 2020-12-10 | Discharge: 2020-12-10 | Disposition: A | Discharge status: Home / Self Care | Payer: 59 | Source: Ambulatory Visit | Attending: Cardiovascular Disease | Admitting: Cardiovascular Disease

## 2020-12-10 DIAGNOSIS — Z20822 Contact with and (suspected) exposure to covid-19: Secondary | ICD-10-CM | POA: Insufficient documentation

## 2020-12-10 DIAGNOSIS — Z01812 Encounter for preprocedural laboratory examination: Secondary | ICD-10-CM | POA: Insufficient documentation

## 2020-12-10 LAB — CBC WITH DIFFERENTIAL/PLATELET
Basophils Absolute: 0 10*3/uL (ref 0.0–0.2)
Basos: 1 %
EOS (ABSOLUTE): 0.2 10*3/uL (ref 0.0–0.4)
Eos: 4 %
Hematocrit: 41.7 % (ref 37.5–51.0)
Hemoglobin: 14.3 g/dL (ref 13.0–17.7)
Immature Grans (Abs): 0 10*3/uL (ref 0.0–0.1)
Immature Granulocytes: 0 %
Lymphocytes Absolute: 1.6 10*3/uL (ref 0.7–3.1)
Lymphs: 29 %
MCH: 30.1 pg (ref 26.6–33.0)
MCHC: 34.3 g/dL (ref 31.5–35.7)
MCV: 88 fL (ref 79–97)
Monocytes Absolute: 0.4 10*3/uL (ref 0.1–0.9)
Monocytes: 7 %
Neutrophils Absolute: 3.2 10*3/uL (ref 1.4–7.0)
Neutrophils: 59 %
Platelets: 186 10*3/uL (ref 150–450)
RBC: 4.75 x10E6/uL (ref 4.14–5.80)
RDW: 12.4 % (ref 11.6–15.4)
WBC: 5.4 10*3/uL (ref 3.4–10.8)

## 2020-12-10 LAB — BASIC METABOLIC PANEL
BUN/Creatinine Ratio: 16 (ref 10–24)
BUN: 15 mg/dL (ref 8–27)
CO2: 20 mmol/L (ref 20–29)
Calcium: 8.8 mg/dL (ref 8.6–10.2)
Chloride: 105 mmol/L (ref 96–106)
Creatinine, Ser: 0.93 mg/dL (ref 0.76–1.27)
Glucose: 121 mg/dL — ABNORMAL HIGH (ref 65–99)
Potassium: 4.6 mmol/L (ref 3.5–5.2)
Sodium: 142 mmol/L (ref 134–144)
eGFR: 93 mL/min/{1.73_m2} (ref >59)

## 2020-12-10 LAB — SARS CORONAVIRUS 2 (TAT 6-24 HRS): SARS Coronavirus 2: NEGATIVE

## 2020-12-10 NOTE — Telephone Encounter (Signed)
Pt verbalized understanding.

## 2020-12-10 NOTE — Telephone Encounter (Signed)
-----   Message from [name], [place] sent at 12/10/2020  2:29 PM EDT ----- Please let the patient know his hemoglobin and platelets are within a normal range. Kidney function and electrolytes also stable. Continue with plans for upcoming cardiac catheterization.

## 2020-12-12 ENCOUNTER — Other Ambulatory Visit: Payer: Self-pay

## 2020-12-12 ENCOUNTER — Encounter (HOSPITAL_COMMUNITY)
Admission: RE | Disposition: A | Discharge status: Home / Self Care | Payer: Self-pay | Source: Home / Self Care | Attending: Cardiovascular Disease

## 2020-12-12 ENCOUNTER — Ambulatory Visit (HOSPITAL_COMMUNITY)
Admission: RE | Admit: 2020-12-12 | Discharge: 2020-12-12 | Disposition: A | Discharge status: Home / Self Care | Payer: 59 | Attending: Cardiovascular Disease | Admitting: Cardiovascular Disease

## 2020-12-12 DIAGNOSIS — I2510 Atherosclerotic heart disease of native coronary artery without angina pectoris: Secondary | ICD-10-CM | POA: Diagnosis not present

## 2020-12-12 DIAGNOSIS — Z7982 Long term (current) use of aspirin: Secondary | ICD-10-CM | POA: Insufficient documentation

## 2020-12-12 DIAGNOSIS — Z955 Presence of coronary angioplasty implant and graft: Secondary | ICD-10-CM | POA: Insufficient documentation

## 2020-12-12 DIAGNOSIS — E785 Hyperlipidemia, unspecified: Secondary | ICD-10-CM | POA: Diagnosis not present

## 2020-12-12 DIAGNOSIS — R9439 Abnormal result of other cardiovascular function study: Secondary | ICD-10-CM

## 2020-12-12 DIAGNOSIS — Z79899 Other long term (current) drug therapy: Secondary | ICD-10-CM | POA: Insufficient documentation

## 2020-12-12 DIAGNOSIS — I10 Essential (primary) hypertension: Secondary | ICD-10-CM | POA: Diagnosis not present

## 2020-12-12 HISTORY — PX: LEFT HEART CATH AND CORONARY ANGIOGRAPHY: CATH118249

## 2020-12-12 SURGERY — LEFT HEART CATH AND CORONARY ANGIOGRAPHY
Anesthesia: LOCAL

## 2020-12-12 MED ORDER — HYDRALAZINE HCL 20 MG/ML IJ SOLN: 10.0000 mg | INTRAMUSCULAR | Status: DC | PRN

## 2020-12-12 MED ORDER — LIDOCAINE HCL (PF) 1 % IJ SOLN
INTRAMUSCULAR | Status: DC | PRN
  Administered 2020-12-12: 2 mL

## 2020-12-12 MED ORDER — SODIUM CHLORIDE 0.9% FLUSH: 3.0000 mL | INTRAVENOUS | Status: DC | PRN

## 2020-12-12 MED ORDER — ONDANSETRON HCL 4 MG/2ML IJ SOLN: 4.0000 mg | Freq: Four times a day (QID) | INTRAMUSCULAR | Status: DC | PRN

## 2020-12-12 MED ORDER — HEPARIN (PORCINE) IN NACL 1000-0.9 UT/500ML-% IV SOLN
INTRAVENOUS | Status: DC | PRN
  Administered 2020-12-12 (×2): 500 mL

## 2020-12-12 MED ORDER — SODIUM CHLORIDE 0.9 % WEIGHT BASED INFUSION: 1.0000 mL/kg/h | INTRAVENOUS | Status: DC

## 2020-12-12 MED ORDER — HEPARIN SODIUM (PORCINE) 1000 UNIT/ML IJ SOLN
INTRAMUSCULAR | Status: DC | PRN
  Administered 2020-12-12: 6000 [IU] via INTRAVENOUS

## 2020-12-12 MED ORDER — MIDAZOLAM HCL 2 MG/2ML IJ SOLN
INTRAMUSCULAR | Status: DC | PRN
  Administered 2020-12-12: 2 mg via INTRAVENOUS

## 2020-12-12 MED ORDER — SODIUM CHLORIDE 0.9% FLUSH: 3.0000 mL | Freq: Two times a day (BID) | INTRAVENOUS | Status: DC

## 2020-12-12 MED ORDER — LABETALOL HCL 5 MG/ML IV SOLN: 10.0000 mg | INTRAVENOUS | Status: DC | PRN

## 2020-12-12 MED ORDER — ASPIRIN 81 MG PO CHEW: 81.0000 mg | CHEWABLE_TABLET | ORAL | Status: DC

## 2020-12-12 MED ORDER — FENTANYL CITRATE (PF) 100 MCG/2ML IJ SOLN
INTRAMUSCULAR | Status: AC
  Filled 2020-12-12: qty 2

## 2020-12-12 MED ORDER — HEPARIN SODIUM (PORCINE) 1000 UNIT/ML IJ SOLN
INTRAMUSCULAR | Status: AC
  Filled 2020-12-12: qty 1

## 2020-12-12 MED ORDER — FENTANYL CITRATE (PF) 100 MCG/2ML IJ SOLN
INTRAMUSCULAR | Status: DC | PRN
  Administered 2020-12-12: 50 ug via INTRAVENOUS

## 2020-12-12 MED ORDER — SODIUM CHLORIDE 0.9 % IV SOLN: INTRAVENOUS | Status: AC

## 2020-12-12 MED ORDER — SODIUM CHLORIDE 0.9 % IV SOLN: 250.0000 mL | INTRAVENOUS | Status: DC | PRN

## 2020-12-12 MED ORDER — MIDAZOLAM HCL 2 MG/2ML IJ SOLN
INTRAMUSCULAR | Status: AC
  Filled 2020-12-12: qty 2

## 2020-12-12 MED ORDER — SODIUM CHLORIDE 0.9 % WEIGHT BASED INFUSION: 3.0000 mL/kg/h | INTRAVENOUS | Status: AC

## 2020-12-12 MED ORDER — VERAPAMIL HCL 2.5 MG/ML IV SOLN
INTRAVENOUS | Status: AC
  Filled 2020-12-12: qty 2

## 2020-12-12 MED ORDER — ACETAMINOPHEN 325 MG PO TABS: 650.0000 mg | ORAL_TABLET | ORAL | Status: DC | PRN

## 2020-12-12 MED ORDER — IOHEXOL 350 MG/ML SOLN
INTRAVENOUS | Status: DC | PRN
  Administered 2020-12-12: 45 mL via INTRA_ARTERIAL

## 2020-12-12 MED ORDER — VERAPAMIL HCL 2.5 MG/ML IV SOLN
INTRAVENOUS | Status: DC | PRN
  Administered 2020-12-12: 10 mL via INTRA_ARTERIAL

## 2020-12-12 MED ORDER — LIDOCAINE HCL (PF) 1 % IJ SOLN
INTRAMUSCULAR | Status: AC
  Filled 2020-12-12: qty 30

## 2020-12-12 MED ORDER — HEPARIN (PORCINE) IN NACL 1000-0.9 UT/500ML-% IV SOLN
INTRAVENOUS | Status: AC
  Filled 2020-12-12: qty 1000

## 2020-12-12 SURGICAL SUPPLY — 9 items
CATH 5FR JL3.5 JR4 ANG PIG MP (CATHETERS) ×2 IMPLANT
DEVICE RAD COMP TR BAND LRG (VASCULAR PRODUCTS) ×2 IMPLANT
GLIDESHEATH SLEND SS 6F .021 (SHEATH) ×2 IMPLANT
GUIDEWIRE INQWIRE 1.5J.035X260 (WIRE) ×1 IMPLANT
INQWIRE 1.5J .035X260CM (WIRE) ×2
KIT HEART LEFT (KITS) ×2 IMPLANT
PACK CARDIAC CATHETERIZATION (CUSTOM PROCEDURE TRAY) ×2 IMPLANT
TRANSDUCER W/STOPCOCK (MISCELLANEOUS) ×2 IMPLANT
TUBING CIL FLEX 10 FLL-RA (TUBING) ×2 IMPLANT

## 2020-12-12 NOTE — Discharge Instructions (Signed)
Radial Site Care  This sheet gives you information about how to care for yourself after your procedure. Your health care provider may also give you more specific instructions. If you have problems or questions, contact your health care provider. What can I expect after the procedure? After the procedure, it is common to have:  Bruising and tenderness at the catheter insertion area. Follow these instructions at home: Medicines  Take over-the-counter and prescription medicines only as told by your health care provider. Insertion site care  Follow instructions from your health care provider about how to take care of your insertion site. Make sure you: ? Wash your hands with soap and water before you change your bandage (dressing). If soap and water are not available, use hand sanitizer. ? Change your dressing as told by your health care provider. ? Leave stitches (sutures), skin glue, or adhesive strips in place. These skin closures may need to stay in place for 2 weeks or longer. If adhesive strip edges start to loosen and curl up, you may trim the loose edges. Do not remove adhesive strips completely unless your health care provider tells you to do that.  Check your insertion site every day for signs of infection. Check for: ? Redness, swelling, or pain. ? Fluid or blood. ? Pus or a bad smell. ? Warmth.  Do not take baths, swim, or use a hot tub until your health care provider approves.  You may shower 24-48 hours after the procedure, or as directed by your health care provider. ? Remove the dressing and gently wash the site with plain soap and water. ? Pat the area dry with a clean towel. ? Do not rub the site. That could cause bleeding.  Do not apply powder or lotion to the site. Activity  For 24 hours after the procedure, or as directed by your health care provider: ? Do not flex or bend the affected arm. ? Do not push or pull heavy objects with the affected arm. ? Do not drive  yourself home from the hospital or clinic. You may drive 24 hours after the procedure unless your health care provider tells you not to. ? Do not operate machinery or power tools.  Do not lift anything that is heavier than 10 lb (4.5 kg), or the limit that you are told, until your health care provider says that it is safe.  Ask your health care provider when it is okay to: ? Return to work or school. ? Resume usual physical activities or sports. ? Resume sexual activity.   General instructions  If the catheter site starts to bleed, raise your arm and put firm pressure on the site. If the bleeding does not stop, get help right away. This is a medical emergency.  If you went home on the same day as your procedure, a responsible adult should be with you for the first 24 hours after you arrive home.  Keep all follow-up visits as told by your health care provider. This is important. Contact a health care provider if:  You have a fever.  You have redness, swelling, or yellow drainage around your insertion site. Get help right away if:  You have unusual pain at the radial site.  The catheter insertion area swells very fast.  The insertion area is bleeding, and the bleeding does not stop when you hold steady pressure on the area.  Your arm or hand becomes pale, cool, tingly, or numb. These symptoms may represent a serious   problem that is an emergency. Do not wait to see if the symptoms will go away. Get medical help right away. Call your local emergency services (911 in the [place] Do not drive yourself to the hospital. Summary  After the procedure, it is common to have bruising and tenderness at the site.  Follow instructions from your health care provider about how to take care of your radial site wound. Check the wound every day for signs of infection.  Do not lift anything that is heavier than 10 lb (4.5 kg), or the limit that you are told, until your health care provider says that it  is safe. This information is not intended to replace advice given to you by your health care provider. Make sure you discuss any questions you have with your health care provider. Document Revised: 08/25/2017 Document Reviewed: 08/25/2017 Elsevier Patient Education  2021 Elsevier Inc.  

## 2020-12-12 NOTE — Interval H&P Note (Signed)
History and Physical Interval Note:  12/12/2020 9:53 AM  Ulice Brilliant  has presented today for surgery, with the diagnosis of abnormal nuclear stress test.  The various methods of treatment have been discussed with the patient and family. After consideration of risks, benefits and other options for treatment, the patient has consented to  Procedure(s): LEFT HEART CATH AND CORONARY ANGIOGRAPHY (N/A) as a surgical intervention.  The patient's history has been reviewed, patient examined, no change in status, stable for surgery.  I have reviewed the patient's chart and labs.  Questions were answered to the patient's satisfaction.    Cath Lab Visit (complete for each Cath Lab visit)  Clinical Evaluation Leading to the Procedure:   ACS: No.  Non-ACS:    Anginal Classification: CCS II  Anti-ischemic medical therapy: Minimal Therapy (1 class of medications)  Non-Invasive Test Results: Intermediate-risk stress test findings: cardiac mortality 1-3%/year  Prior CABG: No previous CABG        Verne Carrow

## 2020-12-13 ENCOUNTER — Encounter (HOSPITAL_COMMUNITY): Payer: Self-pay | Admitting: Cardiovascular Disease

## 2020-12-16 ENCOUNTER — Ambulatory Visit: Payer: 59 | Admitting: Physician Assistant

## 2020-12-16 NOTE — Addendum Note (Signed)
Encounter addended by: Robie Ridge A on: 12/16/2020 3:49 PM  Actions taken: Imaging Exam ended, Charge Capture section accepted

## 2021-01-02 ENCOUNTER — Other Ambulatory Visit: Payer: Self-pay

## 2021-01-02 ENCOUNTER — Ambulatory Visit (INDEPENDENT_AMBULATORY_CARE_PROVIDER_SITE_OTHER): Payer: 59 | Admitting: Student

## 2021-01-02 ENCOUNTER — Encounter: Payer: Self-pay | Admitting: Student

## 2021-01-02 VITALS — BP 126/76 | HR 73 | Ht 71.0 in | Wt 246.6 lb

## 2021-01-02 DIAGNOSIS — E785 Hyperlipidemia, unspecified: Secondary | ICD-10-CM

## 2021-01-02 DIAGNOSIS — R7301 Impaired fasting glucose: Secondary | ICD-10-CM

## 2021-01-02 DIAGNOSIS — I10 Essential (primary) hypertension: Secondary | ICD-10-CM

## 2021-01-02 DIAGNOSIS — I2510 Atherosclerotic heart disease of native coronary artery without angina pectoris: Secondary | ICD-10-CM | POA: Diagnosis not present

## 2021-01-02 MED ORDER — ATORVASTATIN CALCIUM 80 MG PO TABS: 80.0000 mg | ORAL_TABLET | Freq: Every day | ORAL | 3 refills | Status: DC

## 2021-01-02 NOTE — Progress Notes (Signed)
Cardiology Office Note    Date:  01/02/2021   ID:  Dennis Morrow, DOB [birth year], MRN [id number]  PCP:  Assunta FoundGolding, John, MD  Cardiologist: Charlton HawsPeter Nishan, MD    Chief Complaint  Patient presents with  . Follow-up    S/p cardiac catheterization    History of Present Illness:    Dennis Morrow is a 60 y.o. male with past medical history of CAD (s/p NSTEMI in 02/2015 with DES to distal RCA extending into the PLA 2 vessel and transient dissection in the more distal PLA 2 treated with PTCA and DES to Morrow), HTN, HLD and obesity who presents to the office today for follow-up from his recent cardiac catheterization.   He was last examined by myself in 11/2020 for follow-up from his recent stress test which showed findings consistent with prior inferior/inferior lateral and apex myocardial infarction with mild to moderate peri-infarct ischemia and Dr. Eden EmmsNishan recommended a cardiac catheterization for definitive evaluation. He reported having bilateral shoulder pain at the time of his visit but this was felt to be mostly musculoskeletal as symptoms improved with heat or massage. A catheterization was arranged as previously recommended and this was performed by Dr. Clifton JamesMcAlhany on 12/12/2020 and showed widely patent RPAV and RPDA stents with 50% Prox-RCA stenosis, 50% mid-LAD stenosis, 50% distal LAD-1 and 30% distal LAD-2 stenoses. His disease did not appear to be flow-limiting and continued medical management was recommended of his CAD.  In talking with the patient today, he reports overall doing well since his recent cardiac catheterization. No complications regarding his radial cath site. He remains active at baseline and does yard work throughout the day. No recent exertional chest pain or dyspnea on exertion. No PND or pitting edema. He still has bilateral shoulder pain but this improves with massage or using the hot tub.   Past Medical History:  Diagnosis Date  . Bradycardia   . CAD (coronary artery  disease)    cath 02/25/2015 50% mid LAD, 100% distal RCA after takeoff of the PDA treated with DES (2.520 mm Synergy DES stent from distal RCA to PLA2, 2.2524 mm Synergy DES stent to Morrow) with transient dissection in the more distal PLA2 treated with PTCA.  Marland Kitchen. Hypercholesterolemia   . Hypertension     Past Surgical History:  Procedure Laterality Date  . CARDIAC CATHETERIZATION N/A 02/25/2015   Procedure: Left Heart Cath and Coronary Angiography;  Surgeon: Lennette Biharihomas A Kelly, MD;  [facility];  Service: Cardiovascular;  Laterality: N/A;  . CARDIAC CATHETERIZATION N/A 02/25/2015   Procedure: Coronary Stent Intervention;  Surgeon: Lennette Biharihomas A Kelly, MD;  Location: [facility] CV [facility];  Service: Cardiovascular;  Laterality: N/A;  . LEFT HEART CATH AND CORONARY ANGIOGRAPHY N/A 12/12/2020   Procedure: LEFT HEART CATH AND CORONARY ANGIOGRAPHY;  Surgeon: Kathleene HazelMcAlhany, Christopher D, MD;  Location: [facility] CV [facility];  Service: Cardiovascular;  Laterality: N/A;  . TONSILLECTOMY      Current Medications: Outpatient Medications Prior to Visit  Medication Sig Dispense Refill  . aspirin EC 81 MG EC tablet Take 1 tablet (81 mg total) by mouth daily. (Patient taking differently: Take 81 mg by mouth in the morning.)    . metoprolol tartrate (LOPRESSOR) 25 MG tablet TAKE 0.5 TABLETS BY MOUTH (12.5 MG TOTAL) BY MOUTH 2 TIMES DAILY (Patient taking differently: Take 12.5 mg by mouth in the morning and at bedtime. TAKE 0.5 TABLETS BY MOUTH (12.5 MG TOTAL) BY MOUTH 2 TIMES DAILY) 90 tablet  3  . Multiple Vitamin (MULTIVITAMIN WITH MINERALS) TABS tablet Take 1 tablet by mouth in the morning.    . naproxen sodium (ALEVE) 220 MG tablet Take 440 mg by mouth 2 (two) times daily as needed (back/knee pain.).    Marland Kitchen nitroGLYCERIN (NITROSTAT) 0.4 MG SL tablet Place 1 tablet (0.4 mg total) under the tongue every 5 (five) minutes x 3 doses as needed for chest pain. 25 tablet 3  . ramipril (ALTACE) 5 MG capsule Take 1  capsule (5 mg total) by mouth daily. (Patient taking differently: Take 5 mg by mouth in the morning.) 90 capsule 3  . atorvastatin (LIPITOR) 40 MG tablet Take 1 tablet (40 mg total) by mouth daily. (Patient taking differently: Take 40 mg by mouth in the morning.) 90 tablet 3   No facility-administered medications prior to visit.     Allergies:   Patient has no known allergies.   Social History   Socioeconomic History  . Marital status: Married    Spouse name: Not on file  . Number of children: Not on file  . Years of education: Not on file  . Highest education level: Not on file  Occupational History  . Not on file  Tobacco Use  . Smoking status: Never Smoker  . Smokeless tobacco: Never Used  Vaping Use  . Vaping Use: Never used  Substance and Sexual Activity  . Alcohol use: Yes    Alcohol/week: 0.0 standard drinks    Comment: socially  . Drug use: No  . Sexual activity: Not on file  Other Topics Concern  . Not on file  Social History Narrative  . Not on file   Social Determinants of Health   Financial Resource Strain: Not on file  Food Insecurity: Not on file  Transportation Needs: Not on file  Physical Activity: Not on file  Stress: Not on file  Social Connections: Not on file     Family History:  The patient's family history includes CVA in his mother.   Review of Systems:    Please see the history of present illness.     All other systems reviewed and are otherwise negative except as noted above.   Physical Exam:    VS:  BP 126/76   Pulse 73   Ht 5\' 11"  (1.803 m)   Wt 246 lb 9.6 oz (111.9 kg)   SpO2 96%   BMI 34.39 kg/m    General: Well developed, well nourished,male appearing in no acute distress. Head: Normocephalic, atraumatic. Neck: No carotid bruits. JVD not elevated.  Lungs: Respirations regular and unlabored, without wheezes or rales.  Heart: Regular rate and rhythm. No S3 or S4.  No murmur, no rubs, or gallops appreciated. Abdomen:  Appears non-distended. No obvious abdominal masses. Msk:  Strength and tone appear normal for age. No obvious joint deformities or effusions. Extremities: No clubbing or cyanosis. No edema.  Distal pedal pulses are 2+ bilaterally. Radial cath site without ecchymosis or evidence of a hematoma.  Neuro: Alert and oriented X 3. Moves all extremities spontaneously. No focal deficits noted. Psych:  Responds to questions appropriately with a normal affect. Skin: No rashes or lesions noted  Wt Readings from Last 3 Encounters:  01/02/21 246 lb 9.6 oz (111.9 kg)  12/12/20 245 lb (111.1 kg)  11/29/20 255 lb (115.7 kg)     Studies/Labs Reviewed:   EKG:  EKG is not ordered today.   Recent Labs: 12/09/2020: BUN 15; Creatinine, Ser 0.93; Hemoglobin 14.3; Platelets 186;  Potassium 4.6; Sodium 142   Lipid Panel    Component Value Date/Time   CHOL 145 11/06/2020 0840   TRIG 121 11/06/2020 0840   HDL 47 11/06/2020 0840   CHOLHDL 2.9 01/26/2018 0951   VLDL 19 01/26/2018 0951   LDLCALC 76 11/06/2020 0840    Additional studies/ records that were reviewed today include:   Cardiac Catheterization: 12/12/2020  Previously placed RPAV stent (unknown type) is widely patent.  Previously placed RPDA stent (unknown type) is widely patent.  Prox RCA lesion is 50% stenosed.  Mid LAD lesion is 50% stenosed.  Dist LAD-1 lesion is 50% stenosed.  Dist LAD-2 lesion is 30% stenosed.   1. Large dominant RCA with moderate proximal stenosis.This does not appear to be flow limiting. Patent PDA and posterolateral artery stents with no restenosis 2. No disease in the moderate caliber Circumflex system 3. Mild to moderate mid LAD stenosis that does not appear to be flow limiting.   Recommendations: Continue medical management of CAD    Assessment:    1. Coronary artery disease involving native coronary artery of native heart without angina pectoris   2. Essential hypertension   3. Hyperlipidemia LDL goal  <70   4. Elevated fasting glucose      Plan:   In order of problems listed above:  1. CAD - He is s/p NSTEMI in 02/2015 with DES to distal RCA extending into the PLA 2 vessel and transient dissection in the more distal PLA 2 treated with PTCA and DES to Morrow. Recent catheterization last month showed patent RPAV and RPDA stents with 50% Prox-RCA stenosis, 50% mid-LAD stenosis, 50% distal LAD-1 and 30% distal LAD-2 stenoses with medical management recommended.  - He continues to have bilateral shoulder pain which seems most consistent with a musculoskeletal etiology but denies any specific anginal symptoms. - Will continue ASA 81 mg daily and Lopressor 12.5 mg twice daily with plans to titrate Atorvastatin from 40 mg daily to 80 mg daily as outlined below.  2. HTN - His blood pressure is well-controlled at 126/76 during today's visit. Continue current medication regimen with Lopressor 12.5 mg twice daily and Ramipril 5 mg daily.  3. HLD - FLP in 11/2020 showed total cholesterol 145, triglycerides 121, HDL 47 and LDL 76. Given his progression of plaque since his prior catheterization in 2016, I did recommend that we further titrate Atorvastatin from 40 mg daily to 80 mg daily for improved control of his LDL. Will plan for FLP and LFT's in 2 months for reassessment.   4. Elevated Fasting Glucose - His fasting glucose was elevated to 121 when checked in 12/2020. I recommended that he have a Hgb A1c checked by his PCP as he is due for a routine physical.  We reviewed the importance of reducing his intake of simple sugars and carbohydrates.   Medication Adjustments/Labs and Tests Ordered: Current medicines are reviewed at length with the patient today.  Concerns regarding medicines are outlined above.  Medication changes, Labs and Tests ordered today are listed in the Patient Instructions below. Patient Instructions   Medication Instructions:  INCREASE Atorvastatin to 80 mg daily   *If you  need a refill on your cardiac medications before your next appointment, please call your pharmacy*   Lab Work: Lipids and CMET in 2 months   If you have labs (blood work) drawn today and your tests are completely normal, you will receive your results only by: Marland Kitchen MyChart Message (if you have MyChart) OR .  A paper copy in the mail If you have any lab test that is abnormal or we need to change your treatment, we will call you to review the results.   Testing/Procedures: None today    Follow-Up: At [facility], you and your health needs are our priority.  As part of our continuing mission to provide you with exceptional heart care, we have created designated Provider Care Teams.  These Care Teams include your primary Cardiologist (physician) and Advanced Practice Providers (APPs -  Physician Assistants and Nurse Practitioners) who all work together to provide you with the care you need, when you need it.  We recommend signing up for the patient portal called "MyChart".  Sign up information is provided on this After Visit Summary.  MyChart is used to connect with patients for Virtual Visits (Telemedicine).  Patients are able to view lab/test results, encounter notes, upcoming appointments, etc.  Non-urgent messages can be sent to your provider as well.   To learn more about what you can do with MyChart, go to [email or web address].    Your next appointment:   12 month(s)  The format for your next appointment:   In Person  Provider:   Charlton Haws, MD   Other Instructions None      Signed, [name], PA-C  01/02/2021 5:29 PM    [facility] [address] [place], [place] [address] Phone: [phone] Fax: [phone]

## 2021-01-02 NOTE — Patient Instructions (Addendum)
  Medication Instructions:  INCREASE Atorvastatin to 80 mg daily   *If you need a refill on your cardiac medications before your next appointment, please call your pharmacy*   Lab Work: Lipids and CMET in 2 months   If you have labs (blood work) drawn today and your tests are completely normal, you will receive your results only by: Marland Kitchen MyChart Message (if you have MyChart) OR . A paper copy in the mail If you have any lab test that is abnormal or we need to change your treatment, we will call you to review the results.   Testing/Procedures: None today    Follow-Up: At [facility], you and your health needs are our priority.  As part of our continuing mission to provide you with exceptional heart care, we have created designated Provider Care Teams.  These Care Teams include your primary Cardiologist (physician) and Advanced Practice Providers (APPs -  Physician Assistants and Nurse Practitioners) who all work together to provide you with the care you need, when you need it.  We recommend signing up for the patient portal called "MyChart".  Sign up information is provided on this After Visit Summary.  MyChart is used to connect with patients for Virtual Visits (Telemedicine).  Patients are able to view lab/test results, encounter notes, upcoming appointments, etc.  Non-urgent messages can be sent to your provider as well.   To learn more about what you can do with MyChart, go to [email or web address].    Your next appointment:   12 month(s)  The format for your next appointment:   In Person  Provider:   Charlton Haws, MD   Other Instructions None

## 2021-03-29 LAB — COMPREHENSIVE METABOLIC PANEL
ALT: 39 IU/L (ref 0–44)
AST: 34 IU/L (ref 0–40)
Albumin/Globulin Ratio: 2 (ref 1.2–2.2)
Albumin: 4.5 g/dL (ref 3.8–4.8)
Alkaline Phosphatase: 56 IU/L (ref 44–121)
BUN/Creatinine Ratio: 19 (ref 10–24)
BUN: 16 mg/dL (ref 8–27)
Bilirubin Total: 0.6 mg/dL (ref 0.0–1.2)
CO2: 20 mmol/L (ref 20–29)
Calcium: 8.8 mg/dL (ref 8.6–10.2)
Chloride: 104 mmol/L (ref 96–106)
Creatinine, Ser: 0.86 mg/dL (ref 0.76–1.27)
Globulin, Total: 2.2 g/dL (ref 1.5–4.5)
Glucose: 109 mg/dL — ABNORMAL HIGH (ref 65–99)
Potassium: 4.3 mmol/L (ref 3.5–5.2)
Sodium: 140 mmol/L (ref 134–144)
Total Protein: 6.7 g/dL (ref 6.0–8.5)
eGFR: 99 mL/min/{1.73_m2} (ref >59)

## 2021-03-29 LAB — LIPID PANEL
Chol/HDL Ratio: 2.6 ratio (ref 0.0–5.0)
Cholesterol, Total: 137 mg/dL (ref 100–199)
HDL: 52 mg/dL (ref >39)
LDL Chol Calc (NIH): 66 mg/dL (ref 0–99)
Triglycerides: 100 mg/dL (ref 0–149)
VLDL Cholesterol Cal: 19 mg/dL (ref 5–40)

## 2021-07-19 ENCOUNTER — Other Ambulatory Visit: Payer: Self-pay | Admitting: Student

## 2021-07-21 NOTE — Telephone Encounter (Signed)
This is a [place] pt.  °

## 2021-08-01 ENCOUNTER — Encounter: Payer: Self-pay | Admitting: Orthopedic Surgery

## 2021-08-01 ENCOUNTER — Other Ambulatory Visit: Payer: Self-pay | Admitting: Orthopedic Surgery

## 2021-08-01 DIAGNOSIS — R051 Acute cough: Secondary | ICD-10-CM

## 2021-08-02 ENCOUNTER — Other Ambulatory Visit: Payer: Self-pay

## 2021-08-02 ENCOUNTER — Ambulatory Visit (HOSPITAL_COMMUNITY)
Admission: RE | Admit: 2021-08-02 | Discharge: 2021-08-02 | Disposition: A | Discharge status: Home / Self Care | Payer: 59 | Source: Ambulatory Visit | Attending: Orthopedic Surgery | Admitting: Orthopedic Surgery

## 2021-08-02 DIAGNOSIS — R051 Acute cough: Secondary | ICD-10-CM | POA: Diagnosis not present

## 2021-12-24 ENCOUNTER — Other Ambulatory Visit: Payer: Self-pay | Admitting: Student

## 2022-02-16 NOTE — Progress Notes (Signed)
Cardiology Office Note    Date:  02/25/2022   ID:  Dennis Morrow, DOB 09/09/1960, MRN [id number]  PCP:  Assunta Found, MD  Cardiologist: Charlton Haws, MD    No chief complaint on file.   History of Present Illness:    Dennis Morrow is a 61 y.o. male with past medical history of CAD (s/p NSTEMI in 02/2015 with DES to distal RCA extending into the PLA 2 vessel and transient dissection in the more distal PLA 2 treated with PTCA and DES to PLA1), HTN, HLD and obesity   Myovue  11/21/21 showed findings consistent with prior inferior/inferior lateral and apex myocardial infarction with mild to moderate peri-infarct ischemia   Dr. Clifton James on 12/12/2020 and showed widely patent RPAV and RPDA stents with 50% Prox-RCA stenosis, 50% mid-LAD stenosis, 50% distal LAD-1 and 30% distal LAD-2 stenoses. Medical Rx   Having more muscular shoulder pains and left hamstring tightness  Married Has 4 step grand children One daughter in [place] just had grand baby Does landscping Likes to drink beer Discussed weight loss Golfs with group on Tuesdays No angina Getting labs with primary     Past Medical History:  Diagnosis Date   Bradycardia    CAD (coronary artery disease)    cath 02/25/2015 50% mid LAD, 100% distal RCA after takeoff of the PDA treated with DES (2.520 mm Synergy DES stent from distal RCA to PLA2, 2.2524 mm Synergy DES stent to PLA1) with transient dissection in the more distal PLA2 treated with PTCA.   Hypercholesterolemia    Hypertension     Past Surgical History:  Procedure Laterality Date   CARDIAC CATHETERIZATION N/A 02/25/2015   Procedure: Left Heart Cath and Coronary Angiography;  Surgeon: [name], MD;  Location: [facility] INVASIVE CV LAB;  Service: Cardiovascular;  Laterality: N/A;   CARDIAC CATHETERIZATION N/A 02/25/2015   Procedure: Coronary Stent Intervention;  Surgeon: [name], MD;  Location: [facility] INVASIVE CV LAB;  Service: Cardiovascular;  Laterality: N/A;   LEFT  HEART CATH AND CORONARY ANGIOGRAPHY N/A 12/12/2020   Procedure: LEFT HEART CATH AND CORONARY ANGIOGRAPHY;  Surgeon: Kathleene Hazel, MD;  Location: [facility] INVASIVE CV LAB;  Service: Cardiovascular;  Laterality: N/A;   TONSILLECTOMY      Current Medications: Outpatient Medications Prior to Visit  Medication Sig Dispense Refill   aspirin EC 81 MG EC tablet Take 1 tablet (81 mg total) by mouth daily. (Patient taking differently: Take 81 mg by mouth in the morning.)     atorvastatin (LIPITOR) 80 MG tablet Take 1 tablet (80 mg total) by mouth daily. 90 tablet 3   metoprolol tartrate (LOPRESSOR) 25 MG tablet TAKE ONE-HALF TABLET (12.5MG  TOTAL) BY MOUTH TWO TIMES DAILY 90 tablet 3   Multiple Vitamin (MULTIVITAMIN WITH MINERALS) TABS tablet Take 1 tablet by mouth in the morning.     naproxen sodium (ALEVE) 220 MG tablet Take 440 mg by mouth 2 (two) times daily as needed (back/knee pain.).     nitroGLYCERIN (NITROSTAT) 0.4 MG SL tablet Place 1 tablet (0.4 mg total) under the tongue every 5 (five) minutes x 3 doses as needed for chest pain. 25 tablet 3   ramipril (ALTACE) 5 MG capsule TAKE ONE CAPSULE (5MG  TOTAL) BY MOUTH DAILY 90 capsule 3   No facility-administered medications prior to visit.     Allergies:   Patient has no known allergies.   Social History   Socioeconomic History   Marital status: Married  Spouse name: Not on file   Number of children: Not on file   Years of education: Not on file   Highest education level: Not on file  Occupational History   Not on file  Tobacco Use   Smoking status: Never   Smokeless tobacco: Never  Vaping Use   Vaping Use: Never used  Substance and Sexual Activity   Alcohol use: Yes    Alcohol/week: 0.0 standard drinks of alcohol    Comment: socially   Drug use: No   Sexual activity: Not on file  Other Topics Concern   Not on file  Social History Narrative   Not on file   Social Determinants of Health   Financial Resource Strain: Not  on file  Food Insecurity: Not on file  Transportation Needs: Not on file  Physical Activity: Not on file  Stress: Not on file  Social Connections: Not on file     Family History:  The patient's family history includes CVA in his mother.   Review of Systems:    Please see the history of present illness.     All other systems reviewed and are otherwise negative except as noted above.   Physical Exam:    VS:  BP 120/76   Pulse 71   Ht 5\' 11"  (1.803 m)   Wt 247 lb (112 kg)   SpO2 97%   BMI 34.45 kg/m    Affect appropriate Healthy:  appears stated age HEENT: normal Neck supple with no adenopathy JVP normal no bruits no thyromegaly Lungs clear with no wheezing and good diaphragmatic motion Heart:  S1/S2 no murmur, no rub, gallop or click PMI normal Abdomen: benighn, BS positve, no tenderness, no AAA no bruit.  No HSM or HJR Distal pulses intact with no bruits Trace pedal  edema Neuro non-focal Skin warm and dry No muscular weakness   Wt Readings from Last 3 Encounters:  02/25/22 247 lb (112 kg)  01/02/21 246 lb 9.6 oz (111.9 kg)  12/12/20 245 lb (111.1 kg)     Studies/Labs Reviewed:   EKG:   SR rate 71  Q in lead 3 02/25/2022   Recent Labs: 03/28/2021: ALT 39; BUN 16; Creatinine, Ser 0.86; Potassium 4.3; Sodium 140   Lipid Panel    Component Value Date/Time   CHOL 137 03/28/2021 0801   TRIG 100 03/28/2021 0801   HDL 52 03/28/2021 0801   CHOLHDL 2.6 03/28/2021 0801   CHOLHDL 2.9 01/26/2018 0951   VLDL 19 01/26/2018 0951   LDLCALC 66 03/28/2021 0801    Additional studies/ records that were reviewed today include:   Cardiac Catheterization: 12/12/2020 Previously placed RPAV stent (unknown type) is widely patent. Previously placed RPDA stent (unknown type) is widely patent. Prox RCA lesion is 50% stenosed. Mid LAD lesion is 50% stenosed. Dist LAD-1 lesion is 50% stenosed. Dist LAD-2 lesion is 30% stenosed.   1. Large dominant RCA with moderate  proximal stenosis.This does not appear to be flow limiting. Patent PDA and posterolateral artery stents with no restenosis 2. No disease in the moderate caliber Circumflex system 3. Mild to moderate mid LAD stenosis that does not appear to be flow limiting.    Recommendations: Continue medical management of CAD     Assessment:    1. Essential hypertension   2. Coronary artery disease involving native coronary artery of native heart without angina pectoris   3. Mixed hyperlipidemia       Plan:   In order of problems listed  above:  1. CAD - He is s/p NSTEMI in 02/2015 with DES to distal RCA extending into the PLA 2 vessel and transient dissection in the more distal PLA 2 treated with PTCA and DES to PLA1. Abnormal myovue 11/21/20 with f/u cath 12/12/20 showed patent RPAV and RPDA stents with 50% Prox-RCA stenosis, 50% mid-LAD stenosis, 50% distal LAD-1 and 30% distal LAD-2 stenoses with medical management recommended. - Will continue ASA 81 mg daily and Lopressor 12.5 mg twice daily with plans to titrate Atorvastatin from 40 mg daily to 80 mg daily as outlined below.  2. HTN - controlled t. Continue current medication regimen with Lopressor 12.5 mg twice daily and Ramipril 5 mg daily.  3. HLD - Post cath 01/02/21  lipitor increased to 80 mg LDL at goal 66   4. Elevated Fasting Glucose - F/U A1c with primary    F/U in a year   Signed, Charlton Haws, MD  02/25/2022 9:44 AM    [facility] [address] [place], [place] [address] Phone: [phone] Fax: [phone]

## 2022-02-25 ENCOUNTER — Encounter: Payer: Self-pay | Admitting: Cardiovascular Disease

## 2022-02-25 ENCOUNTER — Ambulatory Visit: Payer: 59 | Admitting: Cardiovascular Disease

## 2022-02-25 VITALS — BP 120/76 | HR 71 | Ht 71.0 in | Wt 247.0 lb

## 2022-02-25 DIAGNOSIS — I2510 Atherosclerotic heart disease of native coronary artery without angina pectoris: Secondary | ICD-10-CM | POA: Diagnosis not present

## 2022-02-25 DIAGNOSIS — E782 Mixed hyperlipidemia: Secondary | ICD-10-CM

## 2022-02-25 DIAGNOSIS — I10 Essential (primary) hypertension: Secondary | ICD-10-CM

## 2022-02-25 NOTE — Patient Instructions (Signed)
Medication Instructions:  ?Your physician recommends that you continue on your current medications as directed. Please refer to the Current Medication list given to you today. ? ?*If you need a refill on your cardiac medications before your next appointment, please call your pharmacy* ? ? ?Lab Work: ?NONE  ? ?If you have labs (blood work) drawn today and your tests are completely normal, you will receive your results only by: ?MyChart Message (if you have MyChart) OR ?A paper copy in the mail ?If you have any lab test that is abnormal or we need to change your treatment, we will call you to review the results. ? ? ?Testing/Procedures: ?NONE  ? ? ?Follow-Up: ?At [facility], you and your health needs are our priority.  As part of our continuing mission to provide you with exceptional heart care, we have created designated Provider Care Teams.  These Care Teams include your primary Cardiologist (physician) and Advanced Practice Providers (APPs -  Physician Assistants and Nurse Practitioners) who all work together to provide you with the care you need, when you need it. ? ?We recommend signing up for the patient portal called "MyChart".  Sign up information is provided on this After Visit Summary.  MyChart is used to connect with patients for Virtual Visits (Telemedicine).  Patients are able to view lab/test results, encounter notes, upcoming appointments, etc.  Non-urgent messages can be sent to your provider as well.   ?To learn more about what you can do with MyChart, go to [email or web address].   ? ?Your next appointment:   ?1 year(s) ? ?The format for your next appointment:   ?In Person ? ?Provider:   ?Peter Nishan, MD  ? ? ?Other Instructions ?Thank you for choosing [facility]! ? ? ? ?Important Information About Sugar ? ? ? ? ? ? ?

## 2022-04-25 ENCOUNTER — Other Ambulatory Visit: Payer: Self-pay | Admitting: Student

## 2022-05-21 IMAGING — DX DG CHEST 2V
2 series · 2 of 2 positions shown · non-contrast
Comparison: February 23, 2015

CLINICAL DATA: Nonproductive cough x3 weeks.

EXAM:
CHEST - 2 VIEW

[chest pa]
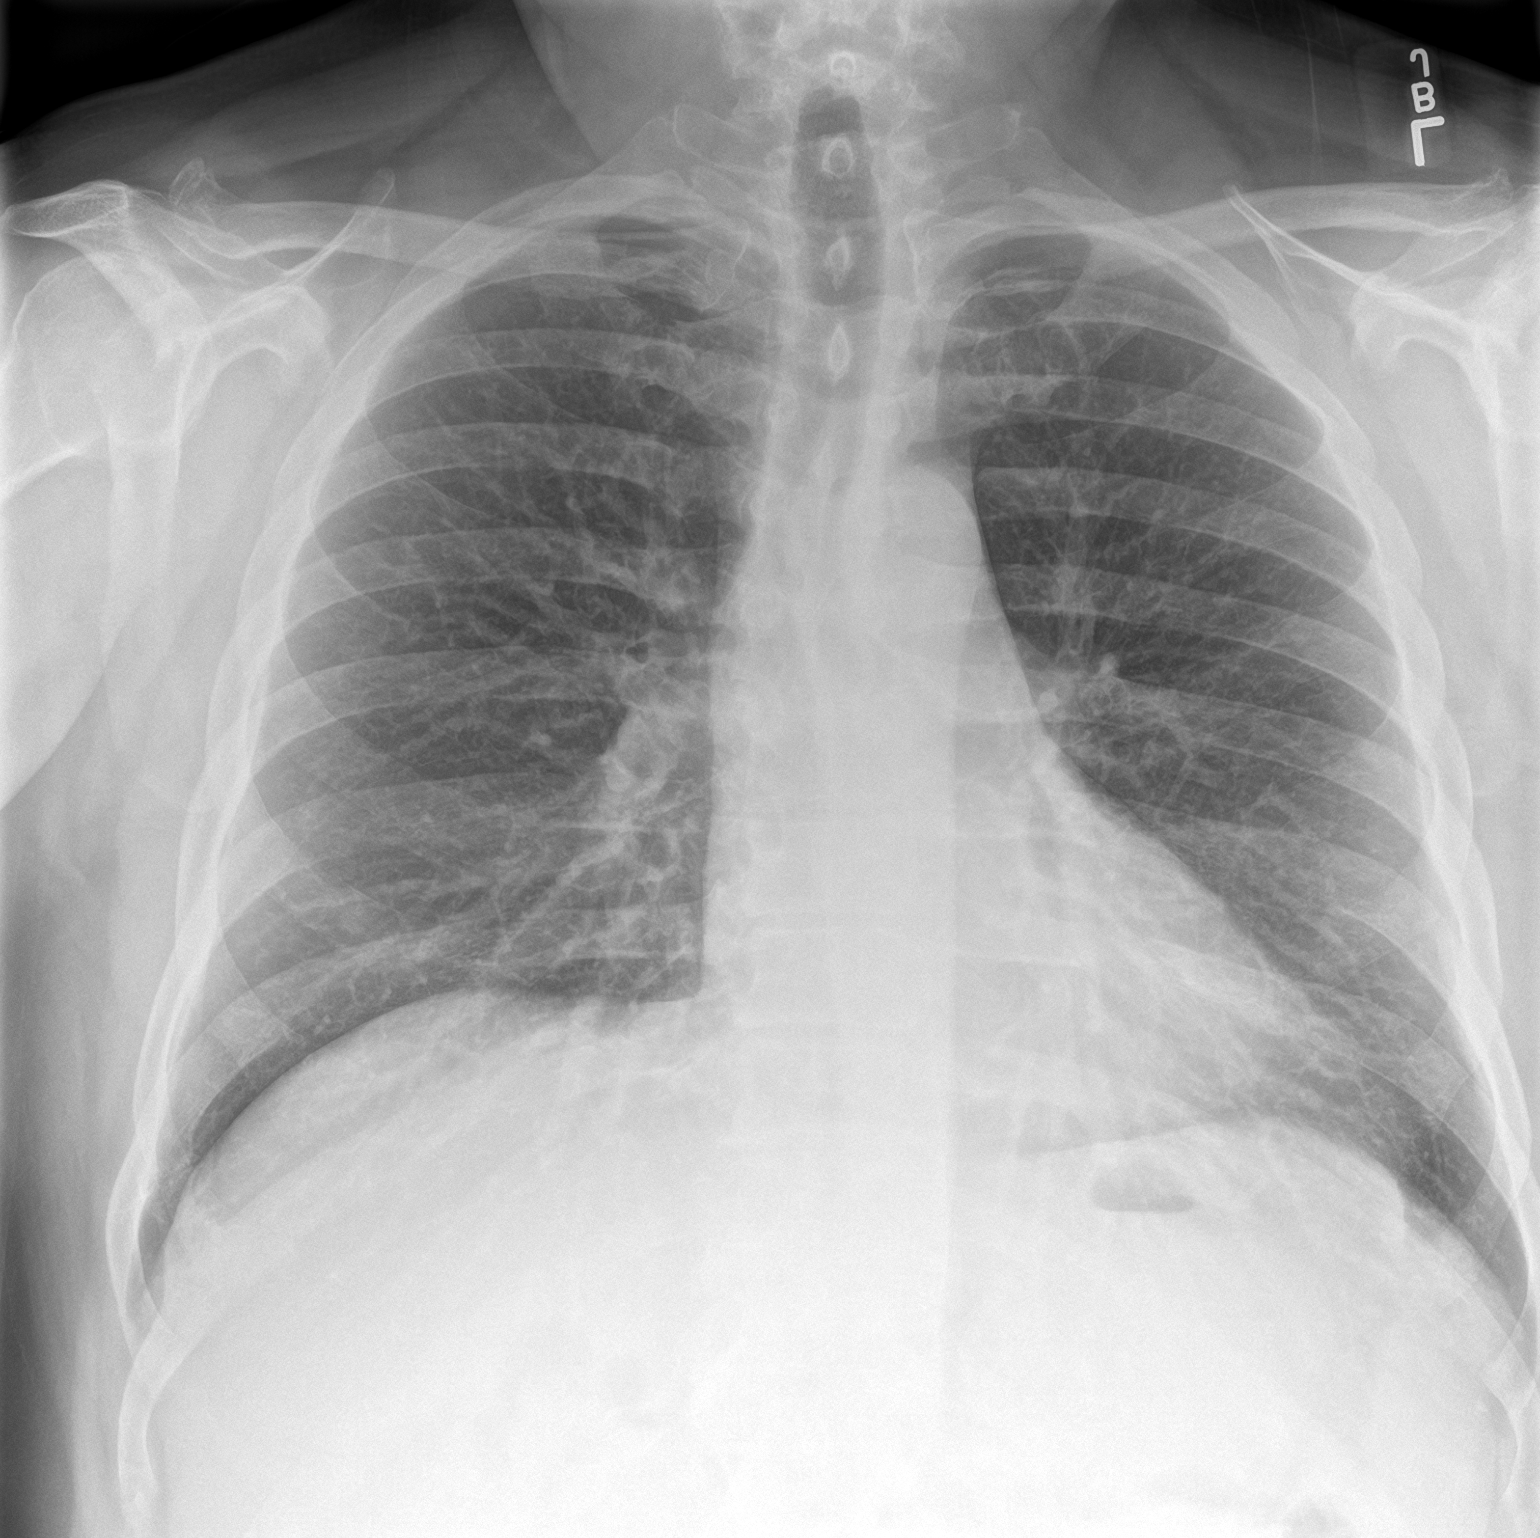

[chest lat]
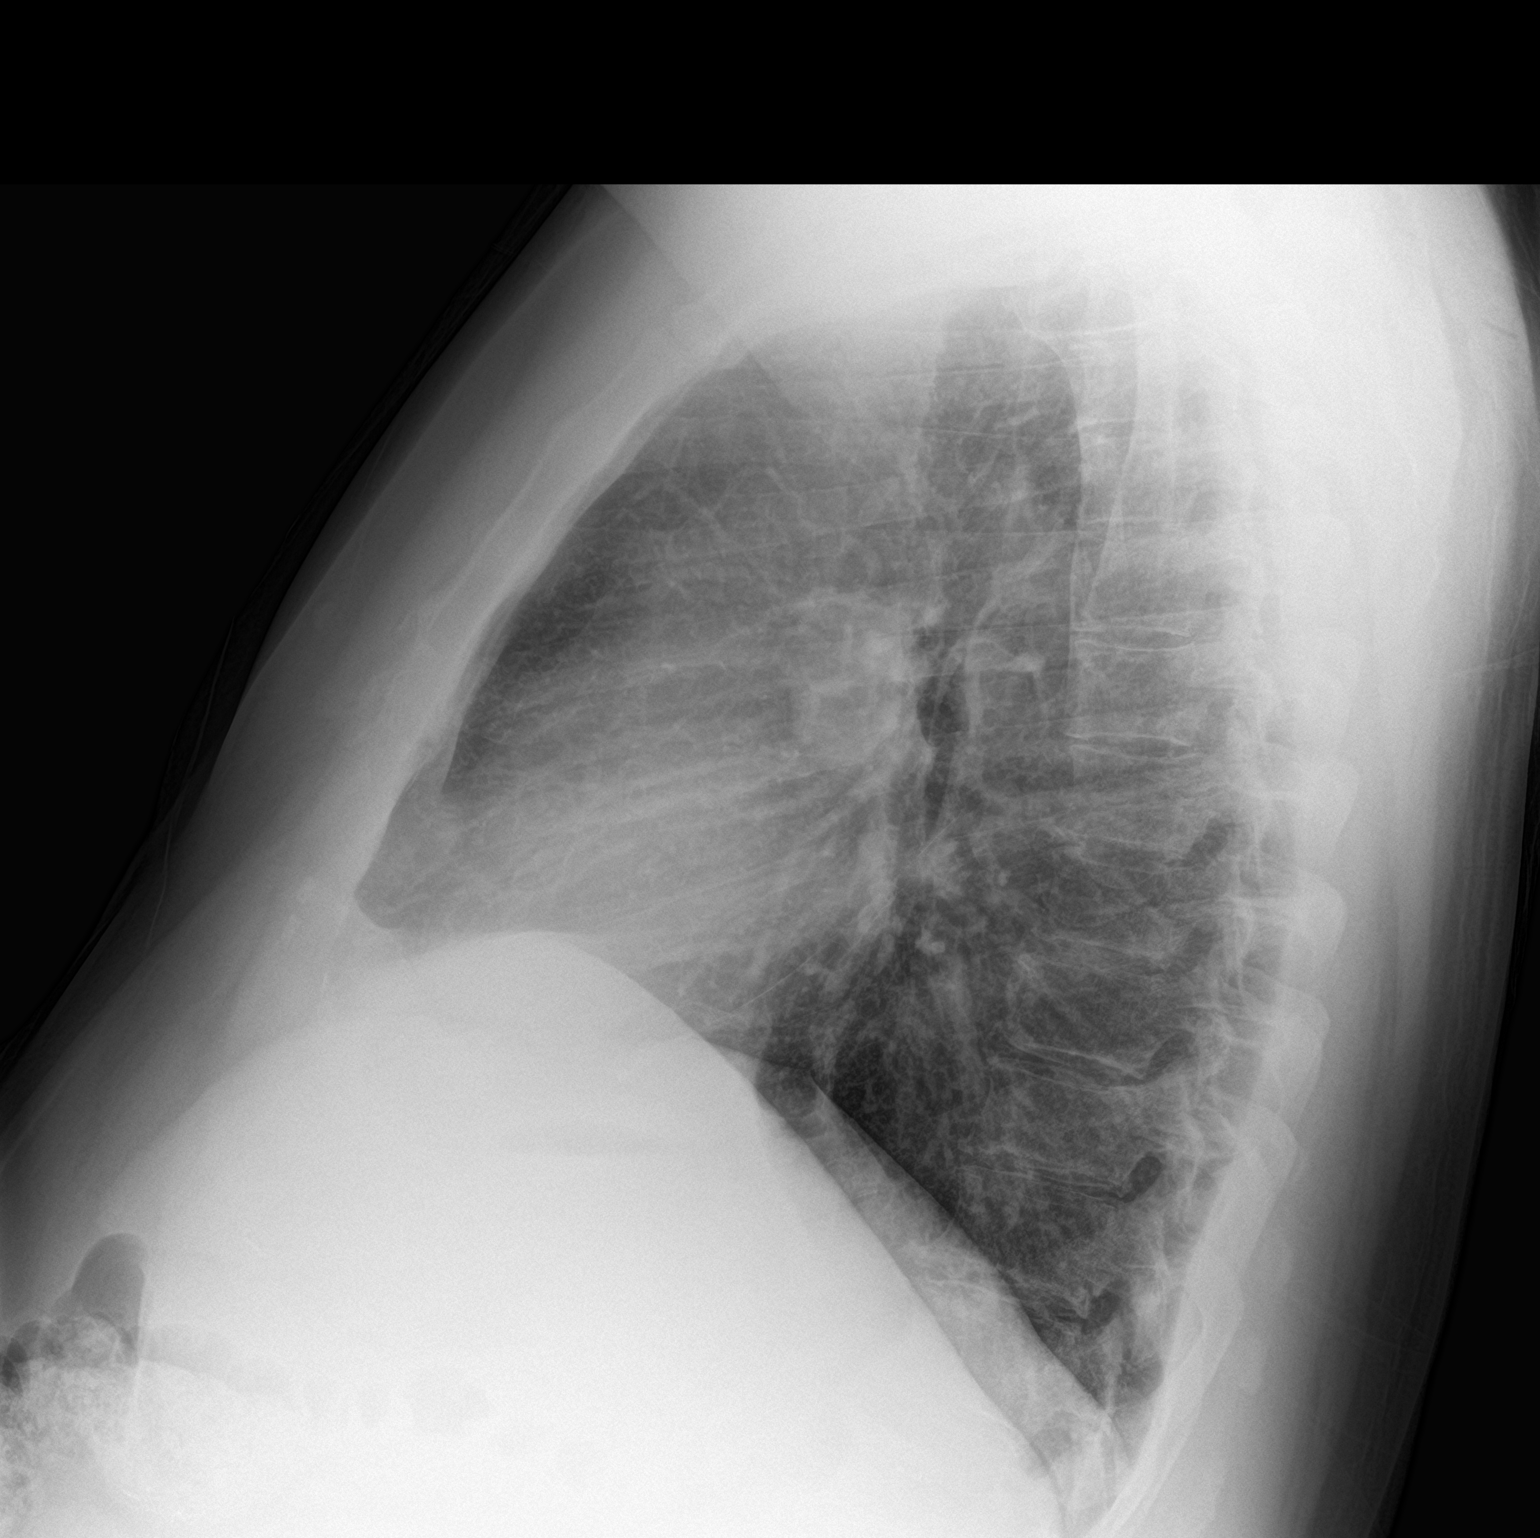

[2 of 2 positions shown; findings below may reference images not displayed]

FINDINGS: The heart size and mediastinal contours are within normal limits.
Both lungs are clear. The visualized skeletal structures are
unremarkable.
IMPRESSION: No active cardiopulmonary disease.

## 2022-07-22 DIAGNOSIS — Z0000 Encounter for general adult medical examination without abnormal findings: Secondary | ICD-10-CM | POA: Diagnosis not present

## 2022-07-22 DIAGNOSIS — I10 Essential (primary) hypertension: Secondary | ICD-10-CM | POA: Diagnosis not present

## 2022-07-22 DIAGNOSIS — I2510 Atherosclerotic heart disease of native coronary artery without angina pectoris: Secondary | ICD-10-CM | POA: Diagnosis not present

## 2022-07-22 DIAGNOSIS — E782 Mixed hyperlipidemia: Secondary | ICD-10-CM | POA: Diagnosis not present

## 2022-08-21 ENCOUNTER — Other Ambulatory Visit: Payer: Self-pay | Admitting: Student

## 2022-10-01 ENCOUNTER — Encounter: Payer: Self-pay | Admitting: Radiology

## 2023-01-09 ENCOUNTER — Other Ambulatory Visit: Payer: Self-pay | Admitting: Cardiovascular Disease

## 2023-03-11 NOTE — Progress Notes (Deleted)
  Cardiology Office Note:  .   Date:  03/11/2023  ID:  Dennis Morrow, DOB 01/17/1961, MRN [id number] PCP: Assunta Found, MD   HeartCare Providers Cardiologist:  Charlton Haws, MD {  History of Present Illness: Marland Kitchen   Dennis Morrow is a 62 y.o. male with a past medical history of CAD status post NSTEMI in 02/2015 with DES to distal RCA extending into the PLA 2 vessel and transient dissection in the more distal PLA 2 treated with PT CA and DES to PLA 1, HTN, HLD, and obesity.  Myoview 11/21/21 showed findings assistant with prior inferior/inferolateral and apex myocardial infarction with mild to moderate peri-infarct ischemia.  Dr. Clifton James on 12/12/2020 and showed widely patent RPA V and RPDA stents with 50% proximal RCA stenosis, 50% mid LAD stenosis, 50% distal LAD 1 and 30% distal LAD to stenosis.  Medical management was suggested.  When he was last seen 02/25/2022 he was having more muscular shoulder pains and left hamstring tightness.  He is married and has 4 step grandchildren.  1 daughter in [place] just had a grandbaby.  Does landscaping.  Likes to drink beer occasionally.  Discussed weight loss goals.  Golfs on Tuesday with a group.  No angina.  Reports no shortness of breath nor dyspnea on exertion. Reports no chest pain, pressure, or tightness. No edema, orthopnea, PND. Reports no palpitations.   ROS: ***  Studies Reviewed: .        Cardiac Catheterization: 12/12/2020 Previously placed RPAV stent (unknown type) is widely patent. Previously placed RPDA stent (unknown type) is widely patent. Prox RCA lesion is 50% stenosed. Mid LAD lesion is 50% stenosed. Dist LAD-1 lesion is 50% stenosed. Dist LAD-2 lesion is 30% stenosed.   1. Large dominant RCA with moderate proximal stenosis.This does not appear to be flow limiting. Patent PDA and posterolateral artery stents with no restenosis 2. No disease in the moderate caliber Circumflex system 3. Mild to moderate mid LAD stenosis  that does not appear to be flow limiting.    Recommendations: Continue medical management of CAD   Risk Assessment/Calculations:   {Does this patient have ATRIAL FIBRILLATION?:[id number]} No BP recorded.  {Refresh Note OR Click here to enter BP  :1}***       Physical Exam:   VS:  There were no vitals taken for this visit.   Wt Readings from Last 3 Encounters:  02/25/22 247 lb (112 kg)  01/02/21 246 lb 9.6 oz (111.9 kg)  12/12/20 245 lb (111.1 kg)    GEN: Well nourished, well developed in no acute distress NECK: No JVD; No carotid bruits CARDIAC: ***RRR, no murmurs, rubs, gallops RESPIRATORY:  Clear to auscultation without rales, wheezing or rhonchi  ABDOMEN: Soft, non-tender, non-distended EXTREMITIES:  No edema; No deformity   ASSESSMENT AND PLAN: .   1.  Hypertension 2.  CAD 3.  Mixed HLD    {Are you ordering a CV Procedure (e.g. stress test, cath, DCCV, TEE, etc)?   Press F2        :[id number]}  Dispo: ***  Signed, Sharlene Dory, PA-C

## 2023-03-12 ENCOUNTER — Ambulatory Visit: Payer: 59 | Admitting: Physician Assistant

## 2023-03-12 ENCOUNTER — Other Ambulatory Visit: Payer: Self-pay | Admitting: Cardiovascular Disease

## 2023-03-12 DIAGNOSIS — E782 Mixed hyperlipidemia: Secondary | ICD-10-CM

## 2023-03-12 DIAGNOSIS — I10 Essential (primary) hypertension: Secondary | ICD-10-CM

## 2023-03-12 DIAGNOSIS — I2510 Atherosclerotic heart disease of native coronary artery without angina pectoris: Secondary | ICD-10-CM

## 2023-05-02 NOTE — Progress Notes (Unsigned)
Cardiology Office Note    Date:  05/03/2023  ID:  Dennis Morrow, Dennis Morrow 01/12/1961, MRN [id number] PCP:  Assunta Found, MD  Cardiologist:  Charlton Haws, MD  Electrophysiologist:  None   Chief Complaint: f/u CAD  History of Present Illness: Marland Kitchen    Dennis Morrow is a 62 y.o. male with visit-pertinent history of CAD (s/p NSTEMI in 02/2015 with DES to distal RCA extending into the PLA 2 vessel and transient dissection in the more distal PLA 2 treated with PTCA and DES to PLA1), HTN, HLD, heart murmur as a child, baseline sinus bradycardia, and obesity  seen for follow-up. Last cath 2022 showed patent prior RPAV/RPDA stents, 50% pRCA, 50% mLAD, 50% dLAD1, 50% dLAD2, recommended to manage medically. Remote echo 2021 EF 50-55%, mild LVH, mild LAE.  He is seen in follow-up doing well without any chest pain or SOB. No claudication, palpitations or syncope. Remains very active doing lawn maintenance and golf. He does note issues with what he believes is plantar fasciitis with some issues with tingling in his feet and discomfort with dorsiflexion. This has been going on for the last year. He has also noticed some periodic nasal congestion in one nostril during times of high pollen. He uses an OTC nasal spray and it resolves. We discussed following up with PCP for these. We also discussed not using something like Afrin/oxymetazoline for more than 3 days.  Labwork independently reviewed: 07/2022 [facility] DXA CBC wnl, K 5.1 (ref range up to 5.2),   Cr 1, LFTs ok, LDL 62, trig 103, TSH wnl 4098 LDL 66, trig 100, CMET OK glu 109 K 4.3, Cr 0.86, CBC OK 2016 TSH OK  ROS: .    Please see the history of present illness. Had one episode during vibrating mower where he had right arm parasthesias that resolved immediately shaking his arm. All other systems are reviewed and otherwise negative.  Studies Reviewed: Marland Kitchen    EKG:  EKG is ordered today, personally reviewed, demonstrating NSR 61bpm, one PVC, prior inferior  infarct, TWI III otherwise nonacute. Stable from prior  CV Studies: Cardiac studies reviewed are outlined and summarized above. Otherwise please see EMR for full report.   Current Reported Medications:.    Current Meds  Medication Sig   aspirin EC 81 MG EC tablet Take 1 tablet (81 mg total) by mouth daily. (Patient taking differently: Take 81 mg by mouth in the morning.)   atorvastatin (LIPITOR) 80 MG tablet TAKE ONE TABLET (80MG  TOTAL) BY MOUTH DAILY   metoprolol tartrate (LOPRESSOR) 25 MG tablet TAKE ONE-HALF TABLET (12.5MG  TOTAL) BY MOUTH TWO TIMES DAILY   Multiple Vitamin (MULTIVITAMIN WITH MINERALS) TABS tablet Take 1 tablet by mouth in the morning.   naproxen sodium (ALEVE) 220 MG tablet Take 440 mg by mouth 2 (two) times daily as needed (back/knee pain.).   nitroGLYCERIN (NITROSTAT) 0.4 MG SL tablet Place 1 tablet (0.4 mg total) under the tongue every 5 (five) minutes x 3 doses as needed for chest pain.   ramipril (ALTACE) 5 MG capsule TAKE ONE CAPSULE (5MG  TOTAL) BY MOUTH DAILY    Physical Exam:    VS:  BP 130/80   Pulse 66   Ht 5\' 11"  (1.803 m)   Wt 248 lb (112.5 kg)   SpO2 95%   BMI 34.59 kg/m    Wt Readings from Last 3 Encounters:  05/03/23 248 lb (112.5 kg)  02/25/22 247 lb (112 kg)  01/02/21 246 lb 9.6 oz (  111.9 kg)    GEN: Well nourished, well developed in no acute distress NECK: No JVD; No carotid bruits CARDIAC: RRR, no murmurs, rubs, gallops RESPIRATORY:  Clear to auscultation without rales, wheezing or rhonchi  ABDOMEN: Soft, non-tender, non-distended EXTREMITIES:  No edema; No acute deformity. Excellent pedal pulses bilaterally  Asessement and Plan:.    1. CAD, HLD - no recent anginal symptoms. Continue ASA, metoprolol, atorvastatin. Discussed avoidance of regular NSAID use with ASA (takes sparingly). Has physical scheduled 07/2023.  2. HTN - SBP top normal. He does not follow at home but reports he checks often when at outside pharmacies with readings  120-125/75-80. The patient was provided instructions on monitoring blood pressure outside the office and relaying results (requested 2-3 additional readings) to our office for review.   3. Sinus bradycardia - stable, no concerns today.  4. Muscle cramping - will get lytes and CK with labs. Foot symptoms do not sound cardiac or PAD in nature. Excellent pedal pulses. Encouraged f/u with PCP to discuss. He requests labs through [facility].   5. Incidental isolated PVC - no ectopy on prolonged auscultation exam. Getting lytes as above. Will get TSH for completeness. In absence of symptoms, follow clinically.    Disposition: F/u with Dr. Eden Emms in 1 year.   Signed, Laurann Montana, PA-C

## 2023-05-03 ENCOUNTER — Ambulatory Visit: Payer: 59 | Attending: Physician Assistant | Admitting: Physician Assistant

## 2023-05-03 ENCOUNTER — Encounter: Payer: Self-pay | Admitting: Physician Assistant

## 2023-05-03 VITALS — BP 130/80 | HR 66 | Ht 71.0 in | Wt 248.0 lb

## 2023-05-03 DIAGNOSIS — I2510 Atherosclerotic heart disease of native coronary artery without angina pectoris: Secondary | ICD-10-CM | POA: Diagnosis not present

## 2023-05-03 DIAGNOSIS — R252 Cramp and spasm: Secondary | ICD-10-CM | POA: Diagnosis not present

## 2023-05-03 DIAGNOSIS — I493 Ventricular premature depolarization: Secondary | ICD-10-CM | POA: Diagnosis not present

## 2023-05-03 DIAGNOSIS — E785 Hyperlipidemia, unspecified: Secondary | ICD-10-CM | POA: Diagnosis not present

## 2023-05-03 DIAGNOSIS — R001 Bradycardia, unspecified: Secondary | ICD-10-CM | POA: Diagnosis not present

## 2023-05-03 DIAGNOSIS — I10 Essential (primary) hypertension: Secondary | ICD-10-CM | POA: Diagnosis not present

## 2023-05-03 NOTE — Patient Instructions (Signed)
Patients taking blood thinners should generally limit from medicines like ibuprofen, Advil, Motrin, naproxen, and Aleve due to risk of stomach bleeding. You may take Tylenol as directed or talk to your primary doctor about alternatives. If you notice any bleeding such as blood in stool, black tarry stools, blood in urine, nosebleeds or any other unusual bleeding, call your doctor immediately. It is not normal to have this kind of bleeding while on a blood thinner and usually indicates there is an underlying problem with one of your body systems that needs to be checked out.    We need to get a better idea of what your blood pressure is running at home. Here are some instructions to follow: - I would recommend using a blood pressure cuff that goes on your arm. The wrist ones can be inaccurate. If you're purchasing one for the first time, try to select one that also reports your heart rate because this can be helpful information as well. - To check your blood pressure, choose a time at least 3 hours after taking your blood pressure medicines. If you can sample it at different times of the day, that's great - it might give you more information about how your blood pressure fluctuates. Remain seated in a chair for 5 minutes quietly beforehand, then check it.  - Please record a list of those readings and call us/send in MyChart message with them for our review when you have 2 or 3 additional readings. Medication Instructions:  Your physician recommends that you continue on your current medications as directed. Please refer to the Current Medication list given to you today.  *If you need a refill on your cardiac medications before your next appointment, please call your pharmacy*   Lab Work: Your physician recommends that you return for lab work in: Today   If you have labs (blood work) drawn today and your tests are completely normal, you will receive your results only by: MyChart Message (if you have  MyChart) OR A paper copy in the mail If you have any lab test that is abnormal or we need to change your treatment, we will call you to review the results.   Testing/Procedures: NONE    Follow-Up: At [facility], you and your health needs are our priority.  As part of our continuing mission to provide you with exceptional heart care, we have created designated Provider Care Teams.  These Care Teams include your primary Cardiologist (physician) and Advanced Practice Providers (APPs -  Physician Assistants and Nurse Practitioners) who all work together to provide you with the care you need, when you need it.  We recommend signing up for the patient portal called "MyChart".  Sign up information is provided on this After Visit Summary.  MyChart is used to connect with patients for Virtual Visits (Telemedicine).  Patients are able to view lab/test results, encounter notes, upcoming appointments, etc.  Non-urgent messages can be sent to your provider as well.   To learn more about what you can do with MyChart, go to [email or web address].    Your next appointment:   1 year(s)  Provider:   You may see Charlton Haws, MD or one of the following Advanced Practice Providers on your designated Care Team:   Randall An, PA-C  Jacolyn Reedy, PA-C    Other Instructions Thank you for choosing [facility]!

## 2023-05-03 NOTE — Addendum Note (Signed)
Addended by: Roseanne Reno on: 05/03/2023 02:13 PM   Modules accepted: Orders

## 2023-05-04 DIAGNOSIS — I493 Ventricular premature depolarization: Secondary | ICD-10-CM | POA: Diagnosis not present

## 2023-05-04 DIAGNOSIS — R252 Cramp and spasm: Secondary | ICD-10-CM | POA: Diagnosis not present

## 2023-05-05 ENCOUNTER — Telehealth: Payer: Self-pay | Admitting: Cardiovascular Disease

## 2023-05-05 ENCOUNTER — Other Ambulatory Visit: Payer: Self-pay

## 2023-05-05 DIAGNOSIS — I2510 Atherosclerotic heart disease of native coronary artery without angina pectoris: Secondary | ICD-10-CM

## 2023-05-05 LAB — TSH: TSH: 2.23 u[IU]/mL (ref 0.450–4.500)

## 2023-05-05 LAB — CK: Total CK: 135 U/L (ref 41–331)

## 2023-05-05 NOTE — Telephone Encounter (Signed)
Patient is returning call in regards to labs. Requesting return call.  

## 2023-05-05 NOTE — Telephone Encounter (Signed)
Returned call to pt. Results given and pt states that he will return for additional labs.

## 2023-05-14 ENCOUNTER — Other Ambulatory Visit: Payer: Self-pay | Admitting: Cardiovascular Disease

## 2023-07-09 ENCOUNTER — Other Ambulatory Visit: Payer: Self-pay | Admitting: Cardiovascular Disease

## 2023-07-23 DIAGNOSIS — Z0000 Encounter for general adult medical examination without abnormal findings: Secondary | ICD-10-CM | POA: Diagnosis not present

## 2023-07-23 DIAGNOSIS — E782 Mixed hyperlipidemia: Secondary | ICD-10-CM | POA: Diagnosis not present

## 2023-07-23 DIAGNOSIS — R7309 Other abnormal glucose: Secondary | ICD-10-CM | POA: Diagnosis not present

## 2023-07-23 DIAGNOSIS — I493 Ventricular premature depolarization: Secondary | ICD-10-CM | POA: Diagnosis not present

## 2023-07-23 DIAGNOSIS — E119 Type 2 diabetes mellitus without complications: Secondary | ICD-10-CM | POA: Diagnosis not present

## 2023-07-23 DIAGNOSIS — R252 Cramp and spasm: Secondary | ICD-10-CM | POA: Diagnosis not present

## 2023-07-24 LAB — MAGNESIUM: Magnesium: 2 mg/dL (ref 1.6–2.3)

## 2023-08-02 DIAGNOSIS — E782 Mixed hyperlipidemia: Secondary | ICD-10-CM | POA: Diagnosis not present

## 2023-08-02 DIAGNOSIS — R7309 Other abnormal glucose: Secondary | ICD-10-CM | POA: Diagnosis not present

## 2023-08-02 DIAGNOSIS — Z1331 Encounter for screening for depression: Secondary | ICD-10-CM | POA: Diagnosis not present

## 2023-08-02 DIAGNOSIS — J069 Acute upper respiratory infection, unspecified: Secondary | ICD-10-CM | POA: Diagnosis not present

## 2023-08-02 DIAGNOSIS — E6609 Other obesity due to excess calories: Secondary | ICD-10-CM | POA: Diagnosis not present

## 2023-08-02 DIAGNOSIS — Z6835 Body mass index (BMI) 35.0-35.9, adult: Secondary | ICD-10-CM | POA: Diagnosis not present

## 2023-08-02 DIAGNOSIS — I10 Essential (primary) hypertension: Secondary | ICD-10-CM | POA: Diagnosis not present

## 2023-08-02 DIAGNOSIS — Z0001 Encounter for general adult medical examination with abnormal findings: Secondary | ICD-10-CM | POA: Diagnosis not present

## 2023-08-02 DIAGNOSIS — I2510 Atherosclerotic heart disease of native coronary artery without angina pectoris: Secondary | ICD-10-CM | POA: Diagnosis not present

## 2023-09-25 ENCOUNTER — Other Ambulatory Visit: Payer: Self-pay | Admitting: Student

## 2024-07-12 ENCOUNTER — Other Ambulatory Visit: Payer: Self-pay | Admitting: Cardiovascular Disease

## 2024-07-25 ENCOUNTER — Ambulatory Visit (HOSPITAL_COMMUNITY)
Admission: RE | Admit: 2024-07-25 | Discharge: 2024-07-25 | Disposition: A | Discharge status: Home / Self Care | Source: Ambulatory Visit

## 2024-07-25 ENCOUNTER — Encounter (INDEPENDENT_AMBULATORY_CARE_PROVIDER_SITE_OTHER): Payer: Self-pay | Admitting: *Deleted

## 2024-07-25 ENCOUNTER — Ambulatory Visit

## 2024-07-25 VITALS — BP 126/78 | HR 65 | Temp 98.1°F | Ht 71.0 in | Wt 246.1 lb

## 2024-07-25 DIAGNOSIS — Z1211 Encounter for screening for malignant neoplasm of colon: Secondary | ICD-10-CM

## 2024-07-25 DIAGNOSIS — L989 Disorder of the skin and subcutaneous tissue, unspecified: Secondary | ICD-10-CM

## 2024-07-25 DIAGNOSIS — Z0001 Encounter for general adult medical examination with abnormal findings: Secondary | ICD-10-CM | POA: Diagnosis not present

## 2024-07-25 DIAGNOSIS — Z7689 Persons encountering health services in other specified circumstances: Secondary | ICD-10-CM

## 2024-07-25 DIAGNOSIS — Z125 Encounter for screening for malignant neoplasm of prostate: Secondary | ICD-10-CM

## 2024-07-25 DIAGNOSIS — M25511 Pain in right shoulder: Secondary | ICD-10-CM | POA: Diagnosis present

## 2024-07-25 DIAGNOSIS — R5383 Other fatigue: Secondary | ICD-10-CM | POA: Diagnosis not present

## 2024-07-25 DIAGNOSIS — M25561 Pain in right knee: Secondary | ICD-10-CM

## 2024-07-25 DIAGNOSIS — I10 Essential (primary) hypertension: Secondary | ICD-10-CM

## 2024-07-25 DIAGNOSIS — Z0000 Encounter for general adult medical examination without abnormal findings: Secondary | ICD-10-CM

## 2024-07-25 DIAGNOSIS — G8929 Other chronic pain: Secondary | ICD-10-CM

## 2024-07-25 DIAGNOSIS — M25562 Pain in left knee: Secondary | ICD-10-CM | POA: Diagnosis present

## 2024-07-25 DIAGNOSIS — E785 Hyperlipidemia, unspecified: Secondary | ICD-10-CM | POA: Diagnosis not present

## 2024-07-25 DIAGNOSIS — N3941 Urge incontinence: Secondary | ICD-10-CM | POA: Diagnosis not present

## 2024-07-25 DIAGNOSIS — G629 Polyneuropathy, unspecified: Secondary | ICD-10-CM

## 2024-07-25 DIAGNOSIS — R7309 Other abnormal glucose: Secondary | ICD-10-CM

## 2024-07-25 NOTE — Progress Notes (Signed)
 "  New Patient Office Visit  Subjective    Patient ID: Dennis Morrow, male    DOB: 12-Jun-1961  Age: 63 y.o. MRN: [id number]  HPI Dennis Morrow presents to establish care and for annual physical.   The patient comes in today for a wellness visit.  A review of their health history was completed. A review of medications was also completed.  Any needed refills; No  Eating habits: Fair, says he can do better  Falls/  MVA accidents in past few months: Had a fall on 01/22/24 where he fell off of a trailer. Clemens on his right shoulder which he is still having issues from.   Regular exercise: Does not exercise regularly, but does play golf every week. Has an active job doing aeronautical engineer.   Sleep: Good, says sometimes racing thoughts keep him awake.   Sexual history: Sexually active, is married  Specialist pt sees on regular basis: Cardiology  Regular eye/dental exams: Yes  Preventative health issues were discussed.   Additional concerns: Would like to see a specialist regarding his right shoulder and bilateral knees. Says that his shoulder has been bothering him for 6 months since his fall back in June. Reports that he did fall on his shoulder. Has aching pain that does not bother him every day, but is aggravated by certain movements of the arm. Starts at the front of the shoulder and runs down his bicep, stopping at the elbow. Denies numbness or tinging. Has tried a TENS unit, massage, heat therapy, and aleve for relief, but pain still persists.  Would like to also see orthopedics about bilateral knees. Has had issues for years. Says that his knees do pop, and he struggles climbing stairs. Describes the pain as aching, and is mainly inside the knee joint. Denies numbness or tingling. Aggravated by activities such as climbing stairs. Reports that he feels like his knees are going to give out sometimes with walking. Knees have given out in the past, and he is afraid of falls. Has not done anything  for this.  Would also like to address his bilateral foot tingling and burning pain. Has had issues with this for years. Says that the pain is mainly on the bottom of his feet. Bothers him when walking, and stretching foot in shower. Wears supportive footwear. Has not taken anything for this.  Patient reports two skin lesions on the right ear, and left forearm that appear as scabs but do not improve or heal. Reports that he files them down at times and applies lotion but lesions return. Right ear lesion does bleed at times due to picking of the skin. Would like dermatology referral.   Past Medical History:  Diagnosis Date   Bradycardia    CAD (coronary artery disease)    cath 02/25/2015 50% mid LAD, 100% distal RCA after takeoff of the PDA treated with DES (2.520 mm Synergy DES stent from distal RCA to PLA2, 2.2524 mm Synergy DES stent to PLA1) with transient dissection in the more distal PLA2 treated with PTCA.   Hypercholesterolemia    Hypertension     Past Surgical History:  Procedure Laterality Date   CARDIAC CATHETERIZATION N/A 02/25/2015   Procedure: Left Heart Cath and Coronary Angiography;  Surgeon: Debby DELENA Sor, MD;  Location: [facility] INVASIVE CV LAB;  Service: Cardiovascular;  Laterality: N/A;   CARDIAC CATHETERIZATION N/A 02/25/2015   Procedure: Coronary Stent Intervention;  Surgeon: Debby DELENA Sor, MD;  Location: [facility] INVASIVE CV LAB;  Service:  Cardiovascular;  Laterality: N/A;   LEFT HEART CATH AND CORONARY ANGIOGRAPHY N/A 12/12/2020   Procedure: LEFT HEART CATH AND CORONARY ANGIOGRAPHY;  Surgeon: Verlin Lonni BIRCH, MD;  Location: [facility] INVASIVE CV LAB;  Service: Cardiovascular;  Laterality: N/A;   TONSILLECTOMY      Family History  Problem Relation Age of Onset   CVA Mother     Social History   Socioeconomic History   Marital status: Married    Spouse name: Not on file   Number of children: Not on file   Years of education: Not on file   Highest education level:  Bachelor's degree (e.g., BA, AB, BS)  Occupational History   Not on file  Tobacco Use   Smoking status: Never   Smokeless tobacco: Never  Vaping Use   Vaping status: Never Used  Substance and Sexual Activity   Alcohol use: Yes    Alcohol/week: 0.0 standard drinks of alcohol    Comment: socially   Drug use: No   Sexual activity: Not on file  Other Topics Concern   Not on file  Social History Narrative   Not on file   Social Drivers of Health   Tobacco Use: Low Risk (07/23/2024)   Patient History    Smoking Tobacco Use: Never    Smokeless Tobacco Use: Never    Passive Exposure: Not on file  Financial Resource Strain: Low Risk (07/23/2024)   Overall Financial Resource Strain (CARDIA)    Difficulty of Paying Living Expenses: Not hard at all  Food Insecurity: No Food Insecurity (07/23/2024)   Epic    Worried About Radiation Protection Practitioner of Food in the Last Year: Never true    Ran Out of Food in the Last Year: Never true  Transportation Needs: No Transportation Needs (07/23/2024)   Epic    Lack of Transportation (Medical): No    Lack of Transportation (Non-Medical): No  Physical Activity: Insufficiently Active (07/23/2024)   Exercise Vital Sign    Days of Exercise per Week: 2 days    Minutes of Exercise per Session: 10 min  Stress: Stress Concern Present (07/23/2024)   [employer] of Occupational Health - Occupational Stress Questionnaire    Feeling of Stress: To some extent  Social Connections: Moderately Isolated (07/23/2024)   Social Connection and Isolation Panel    Frequency of Communication with Friends and Family: More than three times a week    Frequency of Social Gatherings with Friends and Family: Twice a week    Attends Religious Services: Never    Database Administrator or Organizations: No    Attends Engineer, Structural: Not on file    Marital Status: Married  Catering Manager Violence: Not on file  Depression (EYV7-0): Not on file  Alcohol Screen:  Low Risk (07/23/2024)   Alcohol Screen    Last Alcohol Screening Score (AUDIT): 7  Housing: Unknown (07/23/2024)   Epic    Unable to Pay for Housing in the Last Year: No    Number of Times Moved in the Last Year: Not on file    Homeless in the Last Year: No  Utilities: Not on file  Health Literacy: Not on file    Review of Systems  Constitutional:  Negative for chills, fatigue and fever.  HENT:  Negative for sore throat and trouble swallowing.   Eyes:  Negative for visual disturbance.  Respiratory:  Negative for cough, chest tightness, shortness of breath and wheezing.   Cardiovascular:  Negative for chest pain  and palpitations.  Gastrointestinal:  Negative for abdominal pain, constipation, diarrhea, nausea and vomiting.  Genitourinary:  Positive for urgency. Negative for difficulty urinating, penile discharge, penile pain and testicular pain.  Musculoskeletal:  Positive for joint swelling. Negative for back pain.  Skin:        Positive for skin lesion on his right ear, and left forearm.   Neurological:  Negative for dizziness, weakness, light-headedness, numbness and headaches.  Psychiatric/Behavioral:  Negative for self-injury, sleep disturbance and suicidal ideas. The patient is not nervous/anxious.     Objective    BP 126/78 (BP Location: Left Arm, Patient Position: Sitting)   Pulse 65   Temp 98.1 F (36.7 C)   Ht 5' 11 (1.803 m)   Wt 246 lb 2 oz (111.6 kg)   SpO2 95%   BMI 34.33 kg/m   Physical Exam Vitals and nursing note reviewed.  Constitutional:      General: He is not in acute distress.    Appearance: Normal appearance. He is obese. He is not ill-appearing.  Cardiovascular:     Rate and Rhythm: Normal rate and regular rhythm.     Heart sounds: Normal heart sounds, S1 normal and S2 normal. No murmur heard. Pulmonary:     Effort: Pulmonary effort is normal. No respiratory distress.     Breath sounds: Normal breath sounds. No wheezing.  Abdominal:      General: Abdomen is flat. There is no distension.     Palpations: Abdomen is soft.     Tenderness: There is no abdominal tenderness.     Hernia: A hernia is present.     Comments: No obvious masses palpated. Reducible abdominal hernia present on exam.  Musculoskeletal:     Cervical back: No torticollis.     Thoracic back: No scoliosis.     Lumbar back: No scoliosis.     Right lower leg: Edema present.     Left lower leg: Edema present.     Comments: Bilateral knees/BLE: No ecchymosis, erythema, edema or open wounds assessed. No tenderness with palpation. Full ROM of bilateral knees with no pain elicited. Crepitus assessed in bilateral knees with passive ROM. Strength 5+ in BLE.  Right shoulder: No ecchymosis, erythema, edema or open wounds assessed. Tenderness palpated on the anterior portion of the acromioclavicular joint. Pain elicited with abduction of the shoulder at 120 degrees. Full active ROM. No crepitus with movement. Negative Empty Can Test. Strength 5+ of the right shoulder.   Skin:    Capillary Refill: Capillary refill takes more than 3 seconds. Delayed capillary refill noted on bilateral feet. Bilateral feet mildly swollen on exam. No signs of edema in ankles.     Comments: 1 cm elevated, dry, scaly, non-healing skin lesion located on the patient's right forearm. 3 mm elevated, dry, scaly, non-healing skin lesion noted on the right ear with a central brown scab. Lesions have no drainage or surrounded erythema.   Neurological:     Mental Status: He is alert.     Coordination: Romberg sign negative.  Psychiatric:        Mood and Affect: Mood normal.        Behavior: Behavior normal.        Thought Content: Thought content normal.        Judgment: Judgment normal.       07/25/2024    9:33 AM  Depression screen PHQ 2/9  Decreased Interest 0  Down, Depressed, Hopeless 0  PHQ - 2 Score 0  Altered sleeping 0  Tired, decreased energy 0  Change in appetite 0  Trouble  concentrating 0  Moving slowly or fidgety/restless 0  Suicidal thoughts 0  PHQ-9 Score 0  Difficult doing work/chores Not difficult at all       07/25/2024    9:33 AM  GAD 7 : Generalized Anxiety Score  Nervous, Anxious, on Edge 0  Control/stop worrying 0  Worry too much - different things 0  Trouble relaxing 0  Restless 0  Easily annoyed or irritable 0  Afraid - awful might happen 0  Total GAD 7 Score 0  Anxiety Difficulty Not difficult at all   Assessment & Plan:  1. Encounter to establish care (Primary) -Advised patient to get vaccination records sent to our office.   2. Adult wellness visit Adult wellness-complete.wellness physical was conducted today. Importance of diet and exercise were discussed in detail.  Importance of stress reduction and healthy living were discussed.  In addition to this a discussion regarding safety was also covered.  We also reviewed over immunizations and gave recommendations regarding current immunization needed for age.   In addition to this additional areas were also touched on including: Preventative health exams needed:  Colonoscopy: ordered.   Patient was advised yearly wellness exam   3. Elevated glucose level -Patient has had elevated glucose level in the past, as well as elevated A1C. Would like to assess for diabetes given his peripheral neuropathy as well.  - Hemoglobin A1c - CMP14+EGFR  4. Fatigue, unspecified type -Would like to screen for any signs of anemia.  - CBC with Differential  5. Screening for prostate cancer -Shared-decision making with patient to discuss the benefits of screening PSA levels verses digital rectal exam. Patient agrees.  - PSA  6. Hyperlipidemia, unspecified hyperlipidemia type Educated patient about incorporating diet changes such as minimizing fried, high fat, or processed foods in diet. Advised patient to increase exercise as tolerated to help increase good cholesterol levels.  Patient agrees.   -Will assess lipid levels to ensure cholesterol levels are being managed due to increased ASCVD risk.  - Lipid Panel  7. Essential hypertension Patient educated on DASH-style diet, sodium restriction, and lifestyle modifications to support blood pressure control. Patient verbalized understanding, and blood pressure will continue to be monitored at home.   - Hemoglobin A1c - Lipid Panel - CMP14+EGFR  8. Chronic right shoulder pain -Due to history of a fall six months ago, and direct impact to shoulder, would like to proceed with x-rays at this time to evaluate for any underlying pathology or fractures. Patient agrees. -Advised patient to continue taking aleve, applying heat therapy, and using TENS unit for pain management until patient is able to see orthopedics.  - DG Shoulder Right - Ambulatory referral to Orthopedic Surgery  9. Chronic pain of both knees -Provided education about safety around stairs and in the home to reduce risk for falls. Advised patient that this is most likely due to arthritis, but would like to complete bilateral knee x-rays due to popping of knees to rule out any underlying pathology. Patient agrees.  -Advised NSAID's and regular exercise to help with symptom management until he can be evaluated by orthopedics.  - DG Knee Complete 4 Views Right - DG Knee Complete 4 Views Left - Ambulatory referral to Orthopedic Surgery  10. Neuropathy -Talked with patient about ruling out any signs of diabetes or Vitamin B12 deficiency at this time before I recommend referral to podiatry. Ruled out PAD due  to lack of history of smoking or no pain with walking. Will review lab results first before recommending any medications to help with peripheral neuropathy.  - Vitamin B12  11. Screening for colon cancer - Ambulatory referral to Gastroenterology  12. Non-healing skin lesion -Due to history of non-healing skin lesions and patient having increased sun exposure, would like  patient to get established with dermatologist. Advised patient on the use of SPF products.  - Ambulatory referral to Dermatology  13. Urgency incontinence -Symptoms most consistent with age-related changes in bladder function. Education provided regarding normal bladder changes associated with aging. Will obtain PSA to evaluate for potential underlying cause of urinary urgency.  - PSA   Damien KATHEE Pringle, FNP  "

## 2024-07-26 ENCOUNTER — Ambulatory Visit: Payer: Self-pay

## 2024-07-26 LAB — VITAMIN B12: Vitamin B-12: 457 pg/mL (ref 232–1245)

## 2024-07-26 LAB — CMP14+EGFR
ALT: 49 IU/L — ABNORMAL HIGH (ref 0–44)
AST: 44 IU/L — ABNORMAL HIGH (ref 0–40)
Albumin: 4.6 g/dL (ref 3.9–4.9)
Alkaline Phosphatase: 53 IU/L (ref 47–123)
BUN/Creatinine Ratio: 17 (ref 10–24)
BUN: 15 mg/dL (ref 8–27)
Bilirubin Total: 0.5 mg/dL (ref 0.0–1.2)
CO2: 21 mmol/L (ref 20–29)
Calcium: 9 mg/dL (ref 8.6–10.2)
Chloride: 104 mmol/L (ref 96–106)
Creatinine, Ser: 0.86 mg/dL (ref 0.76–1.27)
Globulin, Total: 2.2 g/dL (ref 1.5–4.5)
Glucose: 107 mg/dL — ABNORMAL HIGH (ref 70–99)
Potassium: 4.6 mmol/L (ref 3.5–5.2)
Sodium: 139 mmol/L (ref 134–144)
Total Protein: 6.8 g/dL (ref 6.0–8.5)
eGFR: 96 mL/min/1.73

## 2024-07-26 LAB — CBC WITH DIFFERENTIAL/PLATELET
Basophils Absolute: 0 x10E3/uL (ref 0.0–0.2)
Basos: 1 %
EOS (ABSOLUTE): 0.2 x10E3/uL (ref 0.0–0.4)
Eos: 4 %
Hematocrit: 41.6 % (ref 37.5–51.0)
Hemoglobin: 13.6 g/dL (ref 13.0–17.7)
Immature Grans (Abs): 0 x10E3/uL (ref 0.0–0.1)
Immature Granulocytes: 0 %
Lymphocytes Absolute: 1.1 x10E3/uL (ref 0.7–3.1)
Lymphs: 20 %
MCH: 30 pg (ref 26.6–33.0)
MCHC: 32.7 g/dL (ref 31.5–35.7)
MCV: 92 fL (ref 79–97)
Monocytes Absolute: 0.4 x10E3/uL (ref 0.1–0.9)
Monocytes: 8 %
Neutrophils Absolute: 3.8 x10E3/uL (ref 1.4–7.0)
Neutrophils: 67 %
Platelets: 176 x10E3/uL (ref 150–450)
RBC: 4.53 x10E6/uL (ref 4.14–5.80)
RDW: 12.7 % (ref 11.6–15.4)
WBC: 5.7 x10E3/uL (ref 3.4–10.8)

## 2024-07-26 LAB — LIPID PANEL
Chol/HDL Ratio: 3 ratio (ref 0.0–5.0)
Cholesterol, Total: 149 mg/dL (ref 100–199)
HDL: 50 mg/dL
LDL Chol Calc (NIH): 78 mg/dL (ref 0–99)
Triglycerides: 114 mg/dL (ref 0–149)
VLDL Cholesterol Cal: 21 mg/dL (ref 5–40)

## 2024-07-26 LAB — HEMOGLOBIN A1C
Est. average glucose Bld gHb Est-mCnc: 131 mg/dL
Hgb A1c MFr Bld: 6.2 % — ABNORMAL HIGH (ref 4.8–5.6)

## 2024-07-26 LAB — PSA: Prostate Specific Ag, Serum: 1.7 ng/mL (ref 0.0–4.0)

## 2024-08-08 ENCOUNTER — Ambulatory Visit: Admitting: Orthopedic Surgery

## 2024-10-05 ENCOUNTER — Ambulatory Visit: Admitting: Dermatology

## 2024-10-27 ENCOUNTER — Ambulatory Visit: Admitting: Cardiovascular Disease

## 2025-02-22 ENCOUNTER — Ambulatory Visit
